# Patient Record
Sex: Male | Born: 1990 | Race: Black or African American | Hispanic: No | Marital: Single | Smoking: Never smoker
Health system: Southern US, Community
[De-identification: ages and names within clinical notes are randomized; demographics above are authoritative.]

## PROBLEM LIST (undated history)

## (undated) DIAGNOSIS — K529 Noninfective gastroenteritis and colitis, unspecified: Secondary | ICD-10-CM

## (undated) DIAGNOSIS — Z9289 Personal history of other medical treatment: Secondary | ICD-10-CM

## (undated) DIAGNOSIS — R079 Chest pain, unspecified: Secondary | ICD-10-CM

## (undated) HISTORY — DX: Personal history of other medical treatment: Z92.89

## (undated) HISTORY — PX: NO PAST SURGERIES: SHX2092

## (undated) HISTORY — PX: WISDOM TOOTH EXTRACTION: SHX21

---

## 2011-03-04 ENCOUNTER — Emergency Department (HOSPITAL_COMMUNITY): Payer: No Typology Code available for payment source

## 2011-03-04 ENCOUNTER — Emergency Department (HOSPITAL_COMMUNITY)
Admission: EM | Admit: 2011-03-04 | Discharge: 2011-03-04 | Disposition: A | Discharge status: Home / Self Care | Payer: No Typology Code available for payment source | Attending: Emergency Medicine | Admitting: Emergency Medicine

## 2011-03-04 DIAGNOSIS — T148XXA Other injury of unspecified body region, initial encounter: Secondary | ICD-10-CM | POA: Insufficient documentation

## 2011-03-04 DIAGNOSIS — M545 Low back pain, unspecified: Secondary | ICD-10-CM | POA: Insufficient documentation

## 2011-03-04 DIAGNOSIS — M542 Cervicalgia: Secondary | ICD-10-CM | POA: Insufficient documentation

## 2012-03-01 ENCOUNTER — Other Ambulatory Visit: Payer: Self-pay

## 2012-03-01 ENCOUNTER — Emergency Department (HOSPITAL_COMMUNITY): Payer: BC Managed Care – PPO

## 2012-03-01 ENCOUNTER — Emergency Department (HOSPITAL_COMMUNITY)
Admission: EM | Admit: 2012-03-01 | Discharge: 2012-03-01 | Disposition: A | Discharge status: Home / Self Care | Payer: BC Managed Care – PPO | Attending: Emergency Medicine | Admitting: Emergency Medicine

## 2012-03-01 ENCOUNTER — Encounter (HOSPITAL_COMMUNITY): Payer: Self-pay | Admitting: *Deleted

## 2012-03-01 DIAGNOSIS — R079 Chest pain, unspecified: Secondary | ICD-10-CM | POA: Insufficient documentation

## 2012-03-01 MED ORDER — TRAMADOL HCL 50 MG PO TABS: 50.0000 mg | ORAL_TABLET | Freq: Four times a day (QID) | ORAL | Status: AC | PRN

## 2012-03-01 MED ORDER — OXYCODONE-ACETAMINOPHEN 5-325 MG PO TABS
1.0000 | ORAL_TABLET | Freq: Once | ORAL | Status: AC
  Administered 2012-03-01: 1 via ORAL
  Filled 2012-03-01: qty 1

## 2012-03-01 NOTE — ED Provider Notes (Signed)
History     CSN: [id number]  Arrival date & time 03/01/12  1331   First MD Initiated Contact with Patient 03/01/12 1356      Chief Complaint  Patient presents with  . Chest Pain    (Consider location/radiation/quality/duration/timing/severity/associated sxs/prior treatment) Patient is a 21 y.o. male presenting with chest pain. The history is provided by the patient.  Chest Pain Pertinent negatives for primary symptoms include no fever, no shortness of breath, no cough, no palpitations, no nausea and no vomiting.  Pertinent negatives for associated symptoms include no diaphoresis.   pt c/o intermittent fleeting, recurrent left sided cp.   sxs last only a few seconds at a time. No radiation.   sxs occur frequently.  No cough, f/c/sob. No leg pain or swelling. No hx of recent travel or surgery.  Non smoker. No pmh.  Takes no meds.  History reviewed. No pertinent past medical history.  History reviewed. No pertinent past surgical history.  History reviewed. No pertinent family history.  History  Substance Use Topics  . Smoking status: Not on file  . Smokeless tobacco: Not on file  . Alcohol Use: Not on file      Review of Systems  Constitutional: Negative for fever, chills and diaphoresis.  Respiratory: Negative for cough, chest tightness and shortness of breath.   Cardiovascular: Positive for chest pain. Negative for palpitations and leg swelling.  Gastrointestinal: Negative for nausea and vomiting.  Skin: Negative for rash.  Neurological: Negative for headaches.  Psychiatric/Behavioral: Negative for confusion.  All other systems reviewed and are negative.    Allergies  Review of patient's allergies indicates no known allergies.  Home Medications  No current outpatient prescriptions on file.  BP 123/66  Pulse 71  Temp 97.4 F (36.3 C) (Oral)  Resp 16  Wt 135 lb (61.236 kg)  SpO2 100%  Physical Exam  Nursing note and vitals reviewed. Constitutional: He is  oriented to person, place, and time. He appears well-developed and well-nourished. No distress.  HENT:  Head: Normocephalic and atraumatic.  Eyes: Conjunctivae and EOM are normal.  Neck: Normal range of motion. Neck supple.  Cardiovascular: Normal rate.   No murmur heard. Pulmonary/Chest: Effort normal and breath sounds normal. No respiratory distress. He has no wheezes. He has no rales. He exhibits tenderness.       Mild left ant cw ttp. No crepitance. No masses  Abdominal: Soft. He exhibits no distension.  Musculoskeletal: Normal range of motion. He exhibits no edema.  Neurological: He is alert and oriented to person, place, and time.  Skin: Skin is warm and dry.  Psychiatric: He has a normal mood and affect. Thought content normal.    ED Course  Procedures (including critical care time) Young healthy male with no pmh had fleeting left cp.  Normal pe except mild cw ttp.  sxs not c/w acs. No risk factors for pe and sxs inconsistent with that also.  Nothing suggests pulm infx. No hx trauma.  ptx unlikely but will do cxr to verify absence of ptx.    Labs Reviewed - No data to display No results found.   No diagnosis found.  ECG nsr Rad S1Q3T3 Normal intervals Pattern of possible pe.  MDM  Intermittent left cp No evidence of pe, ptx, pneumonia. No suggestion of acs.  No distress, hypoxia.        Cheri Guppy, MD 03/01/12 1450

## 2012-03-01 NOTE — ED Notes (Addendum)
Patient is alert and oriented x3.  He is complaining of intermittent left chest pain that started this morning at 6 am.  He denies any radiation of pain, nausea, vomiting, lightheadedness, dizziness or diaphoresis. Patient currently rates the pain at a 9 of 10 and now he is saying that the right side of his face has numbness and tingling He states that he has never had any history of this issue.

## 2016-07-26 ENCOUNTER — Emergency Department (HOSPITAL_COMMUNITY): Payer: BLUE CROSS/BLUE SHIELD

## 2016-07-26 ENCOUNTER — Encounter (HOSPITAL_COMMUNITY): Payer: Self-pay | Admitting: Emergency Medicine

## 2016-07-26 ENCOUNTER — Emergency Department (HOSPITAL_COMMUNITY)
Admission: EM | Admit: 2016-07-26 | Discharge: 2016-07-26 | Disposition: A | Discharge status: Home / Self Care | Payer: BLUE CROSS/BLUE SHIELD | Attending: Emergency Medicine | Admitting: Emergency Medicine

## 2016-07-26 DIAGNOSIS — R1084 Generalized abdominal pain: Secondary | ICD-10-CM | POA: Diagnosis present

## 2016-07-26 DIAGNOSIS — Z79899 Other long term (current) drug therapy: Secondary | ICD-10-CM | POA: Insufficient documentation

## 2016-07-26 DIAGNOSIS — K529 Noninfective gastroenteritis and colitis, unspecified: Secondary | ICD-10-CM | POA: Diagnosis not present

## 2016-07-26 LAB — COMPREHENSIVE METABOLIC PANEL
ALBUMIN: 4.6 g/dL (ref 3.5–5.0)
ALT: 33 U/L (ref 17–63)
ANION GAP: 13 (ref 5–15)
AST: 34 U/L (ref 15–41)
Alkaline Phosphatase: 71 U/L (ref 38–126)
BILIRUBIN TOTAL: 0.8 mg/dL (ref 0.3–1.2)
BUN: 20 mg/dL (ref 6–20)
CHLORIDE: 99 mmol/L — AB (ref 101–111)
CO2: 23 mmol/L (ref 22–32)
Calcium: 9.4 mg/dL (ref 8.9–10.3)
Creatinine, Ser: 1.23 mg/dL (ref 0.61–1.24)
GFR calc Af Amer: >60 mL/min (ref >60)
GFR calc non Af Amer: >60 mL/min (ref >60)
GLUCOSE: 124 mg/dL — AB (ref 65–99)
POTASSIUM: 4 mmol/L (ref 3.5–5.1)
Sodium: 135 mmol/L (ref 135–145)
TOTAL PROTEIN: 8 g/dL (ref 6.5–8.1)

## 2016-07-26 LAB — LIPASE, BLOOD: LIPASE: 18 U/L (ref 11–51)

## 2016-07-26 LAB — URINALYSIS, ROUTINE W REFLEX MICROSCOPIC
Bilirubin Urine: NEGATIVE
Glucose, UA: NEGATIVE mg/dL
Hgb urine dipstick: NEGATIVE
Ketones, ur: NEGATIVE mg/dL
LEUKOCYTES UA: NEGATIVE
NITRITE: NEGATIVE
PH: 6 (ref 5.0–8.0)
Protein, ur: NEGATIVE mg/dL
SPECIFIC GRAVITY, URINE: 1.021 (ref 1.005–1.030)

## 2016-07-26 LAB — CBC
HEMATOCRIT: 49 % (ref 39.0–52.0)
HEMOGLOBIN: 17.2 g/dL — AB (ref 13.0–17.0)
MCH: 31.6 pg (ref 26.0–34.0)
MCHC: 35.1 g/dL (ref 30.0–36.0)
MCV: 90.1 fL (ref 78.0–100.0)
Platelets: 231 10*3/uL (ref 150–400)
RBC: 5.44 MIL/uL (ref 4.22–5.81)
RDW: 12.6 % (ref 11.5–15.5)
WBC: 15 10*3/uL — AB (ref 4.0–10.5)

## 2016-07-26 MED ORDER — ONDANSETRON HCL 4 MG PO TABS: 4.0000 mg | ORAL_TABLET | Freq: Four times a day (QID) | ORAL | 0 refills | Status: DC

## 2016-07-26 MED ORDER — SODIUM CHLORIDE 0.9 % IV BOLUS (SEPSIS)
1000.0000 mL | Freq: Once | INTRAVENOUS | Status: AC
  Administered 2016-07-26: 1000 mL via INTRAVENOUS

## 2016-07-26 MED ORDER — ONDANSETRON HCL 4 MG/2ML IJ SOLN
4.0000 mg | Freq: Once | INTRAMUSCULAR | Status: AC
  Administered 2016-07-26: 4 mg via INTRAVENOUS
  Filled 2016-07-26: qty 2

## 2016-07-26 MED ORDER — IOPAMIDOL (ISOVUE-300) INJECTION 61%
INTRAVENOUS | Status: AC
  Administered 2016-07-26: 100 mL
  Filled 2016-07-26: qty 100

## 2016-07-26 NOTE — ED Provider Notes (Signed)
[facility]-EMERGENCY DEPT Provider Note   CSN: [id number] Arrival date & time: 07/26/16  [id number]  By signing my name below, I, Joshua Christensen, attest that this documentation has been prepared under the direction and in the presence of Gilda Creasehristopher J Pollina, MD. Electronically signed, Joshua Christensen, ED Scribe. 07/26/16. 1:16 AM.  History   Chief Complaint Chief Complaint  Patient presents with  . Abdominal Pain  . Emesis    HPI HPI Comments: Joshua Christensen is a 25 y.o. male who presents to the Emergency Department complaining of generalized abdominal pain with multiple episodes of emesis that started yesterday. Pt also reports that he has had generalized body aches intermittently today. He has not taken any medications in attempt to relieve his symptoms. 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 No fever.   The history is provided by the patient. No language interpreter was used.    History reviewed. No pertinent past medical history.  There are no active problems to display for this patient.   History reviewed. No pertinent surgical history.   Home Medications    Prior to Admission medications   Medication Sig Start Date End Date Taking? Authorizing Provider  ondansetron (ZOFRAN) 4 MG tablet Take 1 tablet (4 mg total) by mouth every 6 (six) hours. 07/26/16   Gilda Creasehristopher J Pollina, MD    Family History No family history on file.  Social History Social History  Substance Use Topics  . Smoking status: Never Smoker  . Smokeless tobacco: Never Used  . Alcohol use No     Allergies   Patient has no known allergies.   Review of Systems Review of Systems  Constitutional: Negative for fever.  Gastrointestinal: Positive for abdominal pain, nausea and vomiting.  All other systems reviewed and are negative.    Physical Exam Updated Vital Signs BP 116/72   Pulse 101   Temp 98 F (36.7 C) (Oral)    Resp 16   Ht 5\' 5"  (1.651 m)   Wt 143 lb (64.9 kg)   SpO2 99%   BMI 23.80 kg/m   Physical Exam  Constitutional: He is oriented to person, place, and time. He appears well-developed and well-nourished. No distress.  HENT:  Head: Normocephalic and atraumatic.  Right Ear: Hearing normal.  Left Ear: Hearing normal.  Nose: Nose normal.  Mouth/Throat: Oropharynx is clear and moist and mucous membranes are normal.  Eyes: Conjunctivae and EOM are normal. Pupils are equal, round, and reactive to light.  Neck: Normal range of motion. Neck supple.  Cardiovascular: S1 normal and S2 normal.  Exam reveals no gallop and no friction rub.   No murmur heard. Tachycardic  Pulmonary/Chest: Effort normal and breath sounds normal. No respiratory distress. He exhibits no tenderness.  Abdominal: Soft. Normal appearance and bowel sounds are normal. There is no hepatosplenomegaly. There is tenderness. There is no rebound, no guarding, no tenderness at McBurney's point and negative Murphy's sign. No hernia.  Diffuse tenderness  Musculoskeletal: Normal range of motion.  Neurological: He is alert and oriented to person, place, and time. He has normal strength. No cranial nerve deficit or sensory deficit. Coordination normal. GCS eye subscore is 4. GCS verbal subscore is 5. GCS motor subscore is 6.  Skin: Skin is warm, dry and intact. No rash noted. No cyanosis.  Psychiatric: He has a normal mood and affect. His speech is normal and behavior is normal. Thought content normal.  Nursing  note and vitals reviewed.  Recheck at 02:36 - nausea improved but still experiencing some cramping. Abdominal exam reveals tenderness in the left lower quadrant  ED Treatments / Results  DIAGNOSTIC STUDIES: Oxygen Saturation is 97% on RA, normal by my interpretation.  COORDINATION OF CARE: 1:13 AM-Discussed treatment plan with pt at bedside and pt agreed to plan.   Labs (all labs ordered are listed, but only abnormal results  are displayed) Labs Reviewed  COMPREHENSIVE METABOLIC PANEL - Abnormal; Notable for the following:       Result Value   Chloride 99 (*)    Glucose, Bld 124 (*)    All other components within normal limits  CBC - Abnormal; Notable for the following:    WBC 15.0 (*)    Hemoglobin 17.2 (*)    All other components within normal limits  LIPASE, BLOOD  URINALYSIS, ROUTINE W REFLEX MICROSCOPIC    EKG  EKG Interpretation None       Radiology Ct Abdomen Pelvis W Contrast  Result Date: 07/26/2016 CLINICAL DATA:  Mid abdominal pain radiating to the back for 1 day. EXAM: CT ABDOMEN AND PELVIS WITH CONTRAST TECHNIQUE: Multidetector CT imaging of the abdomen and pelvis was performed using the standard protocol following bolus administration of intravenous contrast. CONTRAST:  ISOVUE-300 IOPAMIDOL (ISOVUE-300) INJECTION 61% COMPARISON:  None. FINDINGS: Lower chest: No acute abnormality. Hepatobiliary: No focal liver abnormality is seen. No gallstones, gallbladder wall thickening, or biliary dilatation. Pancreas: Unremarkable. No pancreatic ductal dilatation or surrounding inflammatory changes. Spleen: Normal in size without focal abnormality. Adrenals/Urinary Tract: Adrenal glands are unremarkable. Kidneys are normal, without renal calculi, focal lesion, or hydronephrosis. Bladder is unremarkable. Stomach/Bowel: Stomach is unremarkable. There is a generous volume of fluid within the lumen of unobstructed small bowel, as well as mild uniform small bowel mural enhancement. Colon is normal. Appendix is normal. Vascular/Lymphatic: No significant vascular findings are present. No enlarged abdominal or pelvic lymph nodes. Reproductive: Unremarkable Other: Small fat containing umbilical hernia. No ascites.  No focal inflammatory changes. Musculoskeletal: No significant skeletal lesion. IMPRESSION: Mildly prominent small bowel with uniform mural enhancement. This may represent enteritis. No bowel obstruction  or perforation. No focal inflammatory changes. Electronically Signed   By: Ellery Plunk M.D.   On: 07/26/2016 04:14    Procedures Procedures (including critical care time)  Medications Ordered in ED Medications  ondansetron (ZOFRAN) injection 4 mg (4 mg Intravenous Given 07/26/16 0125)  sodium chloride 0.9 % bolus 1,000 mL (0 mLs Intravenous Stopped 07/26/16 0304)    Followed by  sodium chloride 0.9 % bolus 1,000 mL (0 mLs Intravenous Stopped 07/26/16 0304)  iopamidol (ISOVUE-300) 61 % injection (100 mLs  Contrast Given 07/26/16 0347)     Initial Impression / Assessment and Plan / ED Course  I have reviewed the triage vital signs and the nursing notes.  Pertinent labs & imaging results that were available during my care of the patient were reviewed by me and considered in my medical decision making (see chart for details).  Clinical Course    Patient presents with complaints of abdominal pain with nausea and vomiting. Examination revealed diffuse tenderness, predominantly left lower quadrant. He did have leukocytosis present upon arrival with tachycardia. Patient treated with IV fluids and antiemetics. Nausea and vomiting has improved but he still had some pain and cramping. CT scan therefore performed. Patient has signs of enteritis but no other acute pathology.  Final Clinical Impressions(s) / ED Diagnoses   Final diagnoses:  Gastroenteritis  New Prescriptions New Prescriptions   ONDANSETRON (ZOFRAN) 4 MG TABLET    Take 1 tablet (4 mg total) by mouth every 6 (six) hours.  I personally performed the services described in this documentation, which was scribed in my presence. The recorded information has been reviewed and is accurate.     Gilda Creasehristopher J Pollina, MD 07/26/16 [id number]

## 2016-07-26 NOTE — ED Triage Notes (Signed)
Patient reports generalized abdominal pain with multiple emesis , fatigue and generalized body aches onset yesterday , denies fever or chills .

## 2016-07-26 NOTE — ED Notes (Signed)
Discharge instructions and prescription reviewed - voiced understanding.  

## 2016-08-21 ENCOUNTER — Emergency Department (HOSPITAL_COMMUNITY): Payer: BLUE CROSS/BLUE SHIELD

## 2016-08-21 ENCOUNTER — Observation Stay (HOSPITAL_COMMUNITY)
Admission: EM | Admit: 2016-08-21 | Discharge: 2016-08-23 | Disposition: A | Discharge status: Home / Self Care | Payer: BLUE CROSS/BLUE SHIELD | Attending: Internal Medicine | Admitting: Internal Medicine

## 2016-08-21 ENCOUNTER — Encounter (HOSPITAL_COMMUNITY): Payer: Self-pay | Admitting: Emergency Medicine

## 2016-08-21 DIAGNOSIS — I4510 Unspecified right bundle-branch block: Secondary | ICD-10-CM | POA: Diagnosis present

## 2016-08-21 DIAGNOSIS — R001 Bradycardia, unspecified: Secondary | ICD-10-CM | POA: Diagnosis present

## 2016-08-21 DIAGNOSIS — Z5181 Encounter for therapeutic drug level monitoring: Secondary | ICD-10-CM | POA: Diagnosis not present

## 2016-08-21 DIAGNOSIS — N289 Disorder of kidney and ureter, unspecified: Secondary | ICD-10-CM

## 2016-08-21 DIAGNOSIS — R0789 Other chest pain: Principal | ICD-10-CM | POA: Insufficient documentation

## 2016-08-21 DIAGNOSIS — R079 Chest pain, unspecified: Secondary | ICD-10-CM | POA: Diagnosis present

## 2016-08-21 HISTORY — DX: Chest pain, unspecified: R07.9

## 2016-08-21 HISTORY — DX: Noninfective gastroenteritis and colitis, unspecified: K52.9

## 2016-08-21 LAB — I-STAT CHEM 8, ED
BUN: 10 mg/dL (ref 6–20)
CHLORIDE: 101 mmol/L (ref 101–111)
Calcium, Ion: 1.17 mmol/L (ref 1.15–1.40)
Creatinine, Ser: 1.3 mg/dL — ABNORMAL HIGH (ref 0.61–1.24)
Glucose, Bld: 90 mg/dL (ref 65–99)
HEMATOCRIT: 47 % (ref 39.0–52.0)
HEMOGLOBIN: 16 g/dL (ref 13.0–17.0)
POTASSIUM: 3.9 mmol/L (ref 3.5–5.1)
SODIUM: 139 mmol/L (ref 135–145)
TCO2: 26 mmol/L (ref 0–100)

## 2016-08-21 LAB — I-STAT TROPONIN, ED: TROPONIN I, POC: 0 ng/mL (ref 0.00–0.08)

## 2016-08-21 MED ORDER — ASPIRIN 81 MG PO CHEW
81.0000 mg | CHEWABLE_TABLET | Freq: Once | ORAL | Status: AC
  Administered 2016-08-22: 81 mg via ORAL
  Filled 2016-08-21: qty 1

## 2016-08-21 NOTE — ED Provider Notes (Signed)
[facility]EMER[facility]ENCY DEPT Provider Note   CSN: [id number] Arrival date & time: 08/21/16  2009     History   Chief Complaint Chief Complaint  Patient presents with  . Anxiety  . URI    HPI Joshua Christensen is a 26 y.o. male.  This a normally healthy 26 year old male who states that he's had URI symptoms for approximately 5 days, but tonight while driving home from work, he developed chest pain radiating bilaterally, hand numbness, shortness of breath.  This was very concerning to him.  He pulled over and called EMS transported to the emergency department for further evaluation.  He states he is better at this time, but still having some mild chest discomfort.  Denies any shortness of breath, nausea, diaphoresis. He does have a positive family history of cardiac disease      History reviewed. No pertinent past medical history.  There are no active problems to display for this patient.   History reviewed. No pertinent surgical history.     Home Medications    Prior to Admission medications   Not on File    Family History History reviewed. No pertinent family history.  Social History Social History  Substance Use Topics  . Smoking status: Never Smoker  . Smokeless tobacco: Never Used  . Alcohol use No     Allergies   Patient has no known allergies.   Review of Systems Review of Systems  Constitutional: Negative for chills and fever.  HENT: Positive for congestion.   Respiratory: Negative for shortness of breath.   Cardiovascular: Positive for chest pain.  Neurological: Negative for dizziness, light-headedness and headaches.  All other systems reviewed and are negative.    Physical Exam Updated Vital Signs BP 134/85   Pulse 74   Temp 97.8 F (36.6 C) (Oral)   Resp 21   SpO2 100%   Physical Exam  Constitutional: He appears well-developed and well-nourished.  HENT:  Head: Normocephalic.  Mouth/Throat: Oropharynx is clear and moist.  Eyes: Pupils  are equal, round, and reactive to light.  Neck: Normal range of motion.  Cardiovascular: Normal pulses.  An irregular rhythm present. Bradycardia present.   Pulmonary/Chest: Effort normal and breath sounds normal.  Abdominal: Soft.  Musculoskeletal: Normal range of motion.  Neurological: He is alert.  Skin: Skin is warm and dry.  Psychiatric: He has a normal mood and affect.  Nursing note and vitals reviewed.    ED Treatments / Results  Labs (all labs ordered are listed, but only abnormal results are displayed) Labs Reviewed  CBC WITH DIFFERENTIAL/PLATELET - Abnormal; Notable for the following:       Result Value   WBC 15.8 (*)    Neutro Abs 13.0 (*)    Monocytes Absolute 1.2 (*)    All other components within normal limits  I-STAT CHEM 8, ED - Abnormal; Notable for the following:    Creatinine, Ser 1.30 (*)    All other components within normal limits  RAPID URINE DRUG SCREEN, HOSP PERFORMED  I-STAT TROPOININ, ED    EKG  EKG Interpretation  Date/Time:  Wednesday August 21 2016 20:12:19 EST Ventricular Rate:  65 PR Interval:  142 QRS Duration: 96 QT Interval:  406 QTC Calculation: 422 R Axis:   80 Text Interpretation:  Normal sinus rhythm with sinus arrhythmia Incomplete right bundle branch block Borderline ECG new RBBB Confirmed by Deretha Emory  MD, SCOTT [phone]) on 08/21/2016 11:21:06 PM       Radiology Dg Chest 2 View  Result Date: 08/21/2016 CLINICAL DATA:  Dyspnea with upper respiratory symptoms starting 1 week ago. EXAM: CHEST  2 VIEW COMPARISON:  03/01/2012 FINDINGS: The heart size and mediastinal contours are within normal limits. Both lungs are clear. The visualized skeletal structures are unremarkable. IMPRESSION: No active cardiopulmonary disease. Electronically Signed   By: Tollie Ethavid  Kwon M.D.   On: 08/21/2016 20:45    Procedures Procedures (including critical care time)  Medications Ordered in ED Medications  aspirin chewable tablet 81 mg (81 mg Oral Given  08/22/16 0029)     Initial Impression / Assessment and Plan / ED Course  I have reviewed the triage vital signs and the nursing notes.  Pertinent labs & imaging results that were available during my care of the patient were reviewed by me and considered in my medical decision making (see chart for details).  Clinical Course   History is concerning for ACS, most likely will admit patient for cardiac rule out with serial enzymes, EKGs  Spoke with cardiology concerning the change in EKG from right bundle branch block to normal sinus rhythm with early report.  They say it is strange and for the hospitalist to consult them in the morning.  They have no emergent input at this time.   Final Clinical Impressions(s) / ED Diagnoses   Final diagnoses:  Chest pain, unspecified type    New Prescriptions New Prescriptions   No medications on file     Earley FavorGail Mordechai Matuszak, NP 08/22/16 0105    Vanetta MuldersScott Zackowski, MD 08/24/16 [phone]

## 2016-08-21 NOTE — ED Triage Notes (Signed)
Pt also states [facility].

## 2016-08-21 NOTE — ED Provider Notes (Signed)
Patient with development of chest pain shortly prior to arrival. He was driving home at tingling in his arms or burning sensation in his feet get the anterior chest pain. Felt a little lightheaded. Patient is occasionally had chest pain in the past but never anything quite like this. EKG here today shows a new right bundle branch block compared to 2013. Troponins pending. But feel that patient requires admission for overnight the cardiac monitoring and serial cardiac enzymes. Realize that the patient is young. Patient denies any drugs. No hypoxia no shortness of breath. Heart rate is actually more of a sinus bradycardia with right bundle branch block.  The patient has had an upper respiratory infection recently. Not severe.  Medical screening examination/treatment/procedure(s) were conducted as a shared visit with non-physician practitioner(s) and myself.  I personally evaluated the patient during the encounter.   EKG Interpretation  Date/Time:  Wednesday August 21 2016 20:12:19 EST Ventricular Rate:  65 PR Interval:  142 QRS Duration: 96 QT Interval:  406 QTC Calculation: 422 R Axis:   80 Text Interpretation:  Normal sinus rhythm with sinus arrhythmia Incomplete right bundle branch block Borderline ECG new RBBB Confirmed by Jalina Blowers  MD, Advik Weatherspoon [phone]) on 08/21/2016 11:21:06 PM      Results for orders placed or performed during the hospital encounter of 07/26/16  Lipase, blood  Result Value Ref Range   Lipase 18 11 - 51 U/L  Comprehensive metabolic panel  Result Value Ref Range   Sodium 135 135 - 145 mmol/L   Potassium 4.0 3.5 - 5.1 mmol/L   Chloride 99 (L) 101 - 111 mmol/L   CO2 23 22 - 32 mmol/L   Glucose, Bld 124 (H) 65 - 99 mg/dL   BUN 20 6 - 20 mg/dL   Creatinine, Ser 6.04 0.61 - 1.24 mg/dL   Calcium 9.4 8.9 - 54.0 mg/dL   Total Protein 8.0 6.5 - 8.1 g/dL   Albumin 4.6 3.5 - 5.0 g/dL   AST 34 15 - 41 U/L   ALT 33 17 - 63 U/L   Alkaline Phosphatase 71 38 - 126 U/L   Total  Bilirubin 0.8 0.3 - 1.2 mg/dL   GFR calc non Af Amer >60 >60 mL/min   GFR calc Af Amer >60 >60 mL/min   Anion gap 13 5 - 15  CBC  Result Value Ref Range   WBC 15.0 (H) 4.0 - 10.5 K/uL   RBC 5.44 4.22 - 5.81 MIL/uL   Hemoglobin 17.2 (H) 13.0 - 17.0 g/dL   HCT 98.1 19.1 - 47.8 %   MCV 90.1 78.0 - 100.0 fL   MCH 31.6 26.0 - 34.0 pg   MCHC 35.1 30.0 - 36.0 g/dL   RDW 29.5 62.1 - 30.8 %   Platelets 231 150 - 400 K/uL  Urinalysis, Routine w reflex microscopic  Result Value Ref Range   Color, Urine YELLOW YELLOW   APPearance CLEAR CLEAR   Specific Gravity, Urine 1.021 1.005 - 1.030   pH 6.0 5.0 - 8.0   Glucose, UA NEGATIVE NEGATIVE mg/dL   Hgb urine dipstick NEGATIVE NEGATIVE   Bilirubin Urine NEGATIVE NEGATIVE   Ketones, ur NEGATIVE NEGATIVE mg/dL   Protein, ur NEGATIVE NEGATIVE mg/dL   Nitrite NEGATIVE NEGATIVE   Leukocytes, UA NEGATIVE NEGATIVE   Dg Chest 2 View  Result Date: 08/21/2016 CLINICAL DATA:  Dyspnea with upper respiratory symptoms starting 1 week ago. EXAM: CHEST  2 VIEW COMPARISON:  03/01/2012 FINDINGS: The heart size and mediastinal contours  are within normal limits. Both lungs are clear. The visualized skeletal structures are unremarkable. IMPRESSION: No active cardiopulmonary disease. Electronically Signed   By: Tollie Ethavid  Kwon M.D.   On: 08/21/2016 20:45   Ct Abdomen Pelvis W Contrast  Result Date: 07/26/2016 CLINICAL DATA:  Mid abdominal pain radiating to the back for 1 day. EXAM: CT ABDOMEN AND PELVIS WITH CONTRAST TECHNIQUE: Multidetector CT imaging of the abdomen and pelvis was performed using the standard protocol following bolus administration of intravenous contrast. CONTRAST:  100mL ISOVUE-300 IOPAMIDOL (ISOVUE-300) INJECTION 61% COMPARISON:  None. FINDINGS: Lower chest: No acute abnormality. Hepatobiliary: No focal liver abnormality is seen. No gallstones, gallbladder wall thickening, or biliary dilatation. Pancreas: Unremarkable. No pancreatic ductal dilatation or  surrounding inflammatory changes. Spleen: Normal in size without focal abnormality. Adrenals/Urinary Tract: Adrenal glands are unremarkable. Kidneys are normal, without renal calculi, focal lesion, or hydronephrosis. Bladder is unremarkable. Stomach/Bowel: Stomach is unremarkable. There is a generous volume of fluid within the lumen of unobstructed small bowel, as well as mild uniform small bowel mural enhancement. Colon is normal. Appendix is normal. Vascular/Lymphatic: No significant vascular findings are present. No enlarged abdominal or pelvic lymph nodes. Reproductive: Unremarkable Other: Small fat containing umbilical hernia. No ascites.  No focal inflammatory changes. Musculoskeletal: No significant skeletal lesion. IMPRESSION: Mildly prominent small bowel with uniform mural enhancement. This may represent enteritis. No bowel obstruction or perforation. No focal inflammatory changes. Electronically Signed   By: Ellery Plunkaniel R Mitchell M.D.   On: 07/26/2016 04:14      Vanetta MuldersScott Azyriah Nevins, MD 08/21/16 2329

## 2016-08-21 NOTE — ED Triage Notes (Signed)
Per EMS:  Pt was driving home tonight when he began to experiencing high anxiety, heavy breaths, tingling in his arms and legs, warm feet, and SOB.  EMs arrived at side of road for pt.  Pt sts no hx.  Lung sounds clear, but has had URI symptoms and concerned for 'walking pneumonia".  Pt is still having waves of anxiety with EMS during transport.

## 2016-08-22 ENCOUNTER — Observation Stay (HOSPITAL_BASED_OUTPATIENT_CLINIC_OR_DEPARTMENT_OTHER): Payer: BLUE CROSS/BLUE SHIELD

## 2016-08-22 ENCOUNTER — Observation Stay (HOSPITAL_COMMUNITY): Payer: BLUE CROSS/BLUE SHIELD

## 2016-08-22 ENCOUNTER — Encounter (HOSPITAL_COMMUNITY): Payer: Self-pay | Admitting: Physician Assistant

## 2016-08-22 DIAGNOSIS — N289 Disorder of kidney and ureter, unspecified: Secondary | ICD-10-CM

## 2016-08-22 DIAGNOSIS — R079 Chest pain, unspecified: Secondary | ICD-10-CM

## 2016-08-22 DIAGNOSIS — I4510 Unspecified right bundle-branch block: Secondary | ICD-10-CM | POA: Diagnosis not present

## 2016-08-22 DIAGNOSIS — R0789 Other chest pain: Secondary | ICD-10-CM

## 2016-08-22 DIAGNOSIS — R072 Precordial pain: Secondary | ICD-10-CM

## 2016-08-22 DIAGNOSIS — Z5181 Encounter for therapeutic drug level monitoring: Secondary | ICD-10-CM | POA: Diagnosis not present

## 2016-08-22 DIAGNOSIS — R001 Bradycardia, unspecified: Secondary | ICD-10-CM | POA: Diagnosis not present

## 2016-08-22 LAB — BASIC METABOLIC PANEL
Anion gap: 7 (ref 5–15)
BUN: 10 mg/dL (ref 6–20)
CALCIUM: 9 mg/dL (ref 8.9–10.3)
CO2: 23 mmol/L (ref 22–32)
CREATININE: 1.19 mg/dL (ref 0.61–1.24)
Chloride: 108 mmol/L (ref 101–111)
GFR calc Af Amer: >60 mL/min (ref >60)
GFR calc non Af Amer: >60 mL/min (ref >60)
GLUCOSE: 87 mg/dL (ref 65–99)
Potassium: 4 mmol/L (ref 3.5–5.1)
Sodium: 138 mmol/L (ref 135–145)

## 2016-08-22 LAB — CBC WITH DIFFERENTIAL/PLATELET
Basophils Absolute: 0 10*3/uL (ref 0.0–0.1)
Basophils Relative: 0 %
EOS ABS: 0.1 10*3/uL (ref 0.0–0.7)
EOS PCT: 0 %
HCT: 45.7 % (ref 39.0–52.0)
Hemoglobin: 15.9 g/dL (ref 13.0–17.0)
LYMPHS ABS: 1.5 10*3/uL (ref 0.7–4.0)
Lymphocytes Relative: 10 %
MCH: 31.3 pg (ref 26.0–34.0)
MCHC: 34.8 g/dL (ref 30.0–36.0)
MCV: 90 fL (ref 78.0–100.0)
MONO ABS: 1.2 10*3/uL — AB (ref 0.1–1.0)
MONOS PCT: 8 %
Neutro Abs: 13 10*3/uL — ABNORMAL HIGH (ref 1.7–7.7)
Neutrophils Relative %: 82 %
PLATELETS: 260 10*3/uL (ref 150–400)
RBC: 5.08 MIL/uL (ref 4.22–5.81)
RDW: 12.3 % (ref 11.5–15.5)
WBC: 15.8 10*3/uL — AB (ref 4.0–10.5)

## 2016-08-22 LAB — D-DIMER, QUANTITATIVE: D-Dimer, Quant: <0.27 ug/mL-FEU (ref 0.00–0.50)

## 2016-08-22 LAB — CBC
HEMATOCRIT: 45.4 % (ref 39.0–52.0)
Hemoglobin: 15 g/dL (ref 13.0–17.0)
MCH: 30.6 pg (ref 26.0–34.0)
MCHC: 33 g/dL (ref 30.0–36.0)
MCV: 92.7 fL (ref 78.0–100.0)
Platelets: 225 10*3/uL (ref 150–400)
RBC: 4.9 MIL/uL (ref 4.22–5.81)
RDW: 12.8 % (ref 11.5–15.5)
WBC: 11.9 10*3/uL — ABNORMAL HIGH (ref 4.0–10.5)

## 2016-08-22 LAB — SAVE SMEAR

## 2016-08-22 LAB — MONONUCLEOSIS SCREEN: Mono Screen: POSITIVE — AB

## 2016-08-22 LAB — RAPID URINE DRUG SCREEN, HOSP PERFORMED
Amphetamines: NOT DETECTED
Barbiturates: NOT DETECTED
Benzodiazepines: NOT DETECTED
Cocaine: NOT DETECTED
OPIATES: NOT DETECTED
Tetrahydrocannabinol: NOT DETECTED

## 2016-08-22 LAB — TROPONIN I
TROPONIN I: 1.32 ng/mL — AB (ref <0.03)
TROPONIN I: 0.03 ng/mL — AB (ref <0.03)
Troponin I: <0.03 ng/mL (ref <0.03)

## 2016-08-22 LAB — ECHOCARDIOGRAM COMPLETE
Height: 65 in
Weight: 2225.6 oz

## 2016-08-22 LAB — RAPID STREP SCREEN (MED CTR MEBANE ONLY): STREPTOCOCCUS, GROUP A SCREEN (DIRECT): NEGATIVE

## 2016-08-22 MED ORDER — MORPHINE SULFATE (PF) 4 MG/ML IV SOLN: 2.0000 mg | INTRAVENOUS | Status: DC | PRN

## 2016-08-22 MED ORDER — SODIUM CHLORIDE 0.9 % IV SOLN
Freq: Once | INTRAVENOUS | Status: AC
  Administered 2016-08-22: 03:00:00 via INTRAVENOUS

## 2016-08-22 MED ORDER — ENOXAPARIN SODIUM 40 MG/0.4ML ~~LOC~~ SOLN
40.0000 mg | SUBCUTANEOUS | Status: DC
  Administered 2016-08-22: 40 mg via SUBCUTANEOUS
  Filled 2016-08-22 (×2): qty 0.4

## 2016-08-22 MED ORDER — GADOBENATE DIMEGLUMINE 529 MG/ML IV SOLN
20.0000 mL | Freq: Once | INTRAVENOUS | Status: AC
  Administered 2016-08-22: 20 mL via INTRAVENOUS

## 2016-08-22 MED ORDER — CARVEDILOL 3.125 MG PO TABS
3.1250 mg | ORAL_TABLET | Freq: Two times a day (BID) | ORAL | Status: DC
  Administered 2016-08-22 – 2016-08-23 (×2): 3.125 mg via ORAL
  Filled 2016-08-22 (×2): qty 1

## 2016-08-22 MED ORDER — SODIUM CHLORIDE 0.9 % IV SOLN: INTRAVENOUS | Status: DC

## 2016-08-22 MED ORDER — SODIUM CHLORIDE 0.9 % IV BOLUS (SEPSIS)
500.0000 mL | Freq: Once | INTRAVENOUS | Status: AC
  Administered 2016-08-22: 500 mL via INTRAVENOUS

## 2016-08-22 MED ORDER — IPRATROPIUM-ALBUTEROL 0.5-2.5 (3) MG/3ML IN SOLN: 3.0000 mL | RESPIRATORY_TRACT | Status: DC | PRN

## 2016-08-22 MED ORDER — ACETAMINOPHEN 325 MG PO TABS: 650.0000 mg | ORAL_TABLET | ORAL | Status: DC | PRN

## 2016-08-22 MED ORDER — IBUPROFEN 600 MG PO TABS
600.0000 mg | ORAL_TABLET | Freq: Three times a day (TID) | ORAL | Status: DC
  Administered 2016-08-22 – 2016-08-23 (×3): 600 mg via ORAL
  Filled 2016-08-22 (×3): qty 1

## 2016-08-22 MED ORDER — ONDANSETRON HCL 4 MG/2ML IJ SOLN: 4.0000 mg | Freq: Four times a day (QID) | INTRAMUSCULAR | Status: DC | PRN

## 2016-08-22 NOTE — Progress Notes (Signed)
Pt seen and examined, admitted earlier this am by Dr.Smith 25/M with no PMH presents with chest pain/ troponin now 1.3, J point elevation on EKG in V2/V3, h/o URI/cough in past 1-2weeks Could be early pericarditis/myocarditis, although EKG not suggestive of pericarditis, ? takotsubo Cards consulted, plan for Cardiac MRI Supportive care, monitor Clinically no evidence of ACS  Joshua CovePreetha Mikolaj Woolstenhulme, MD [phone]

## 2016-08-22 NOTE — Progress Notes (Signed)
CRITICAL VALUE ALERT  Critical value received:  Troponin 1.32  Date of notification:  08/22/2016  Time of notification:  0635  Critical value read back: yes  Nurse who received alert:  Adolphus BirchwoodShanell Fletcher  MD notified (1st page):  Merdis DelayK Schorr NP  Time of first page:  [phone]  MD notified (2nd page):  Time of second page:  Responding MD:    Time MD responded:

## 2016-08-22 NOTE — Consult Note (Signed)
CARDIOLOGY CONSULT NOTE   Patient ID: Bricen Victory MRN: [id number] DOB/AGE: [birth year] 26 y.o.  Admit date: 08/21/2016  Requesting Physician: Dr. Jomarie Longs Primary Physician:   No PCP Per Patient Primary Cardiologist:  New Reason for Consultation:    Chest pain and elevated troponin  HPI: Jamaris Platten is a 26 y.o. male with a history of no past medical history who presented to [facility] emergency department last night via EMS for evaluation of chest pain.  He was seen in the emergency department on 07/26/16 for generalized abdominal pain with vomiting. WBC was elevated at 15.0. CT of the abdomen showed possible enteritis. This was felt to be viral so he was discharged home with no antibiotics. He eventually recovered from this and was feeling well. Last week he developed a cough and cold. There were many people at work that were sick with similar symptoms so he didn't think much of it. He was driving home from work last night when he had sudden onset of right-sided chest pain that was 9 out of 10 in severity. It was described as a pressure as well as sharp and stabbing. He had associated shortness of breath as well as bilateral hand numbness and warmth in his feet. He felt like he was going to pass out and pulled over to the side of the road where he called EMS. He denies syncope. The symptoms continued intermittently until he got to the hospital and they finally self resolved. He did not receive nitroglycerin or morphine. He is currently chest pain-free however last night he did feel like he had some difficulty breathing while laying flat. No LE edema or PND. He does note that he has previously had chest pain for at least 1-2 years that occured infrequently approximately every 2 months that was fleeting in nature and much less severe than last night. Of note, he was seen in the emergency department in 02/2012 for chest pain. Workup was negative and he was discharged home.  He does not  drink and is a never smoker. He denies any illicit drug use. Family history of CAD in his father and maternal grandfather. He did recently start an energy vitamin supplement from [facility]. He does not think this contains caffeine and is not sure the exact name.  In the emergency department first troponin I was negative however second troponin I returned at 1.32. WBC count was 11.9 with lymphocyte predominance. Creatinine 1.3. Urine drug screen negative. D-dimer negative. ECG with incomplete right bundle branch and sinus bradycardia. CXR with no active CP disease.  Past Medical History:  Diagnosis Date  . Chest pain   . Gastroenteritis      History reviewed. No pertinent surgical history.  No Known Allergies  I have reviewed the patient's current medications . enoxaparin (LOVENOX) injection  40 mg Subcutaneous Q24H    acetaminophen, ipratropium-albuterol, morphine injection, ondansetron (ZOFRAN) IV  Prior to Admission medications   Not on File     Social History   Social History  . Marital status: Single    Spouse name: N/A  . Number of children: N/A  . Years of education: N/A   Occupational History  . Not on file.   Social History Main Topics  . Smoking status: Never Smoker  . Smokeless tobacco: Never Used  . Alcohol use No  . Drug use: No  . Sexual activity: Not on file   Other Topics Concern  . Not on file   Social History  Narrative  . No narrative on file    Family Status  Relation Status  . Mother Alive  . Father   . Maternal Grandmother   . Maternal Grandfather    Family History  Problem Relation Age of Onset  . Healthy Mother   . CAD Father   . CVA Maternal Grandmother   . CAD Maternal Grandfather       ROS:  Full 14 point review of systems complete and found to be negative unless listed above.  Physical Exam: Blood pressure 126/62, pulse 62, temperature 97.9 F (36.6 C), temperature source Oral, resp. rate 20, height 5\' 5"  (1.651 m), weight 139 lb  1.6 oz (63.1 kg), SpO2 98 %.  General: Well developed, well nourished, male in no acute distress Head: Eyes PERRLA, No xanthomas.   Normocephalic and atraumatic, oropharynx without edema or exudate.  Lungs: CTAB Heart: HRRR S1 S2, no rub/gallop, Heart regular rate and rhythm with S1, S2  No murmur. pulses are 2+ extrem.   Neck: No carotid bruits. No lymphadenopathy. no JVD. Abdomen: Bowel sounds present, abdomen soft and non-tender without masses or hernias noted. Msk:  No spine or cva tenderness. No weakness, no joint deformities or effusions. Extremities: No clubbing or cyanosis. No LE edema.  Neuro: Alert and oriented X 3. No focal deficits noted. Psych:  Good affect, responds appropriately Skin: No rashes or lesions noted.  Labs:   Lab Results  Component Value Date   WBC 11.9 (H) 08/22/2016   HGB 15.0 08/22/2016   HCT 45.4 08/22/2016   MCV 92.7 08/22/2016   PLT 225 08/22/2016   No results for input(s): INR in the last 72 hours.   Recent Labs Lab 08/22/16 0642  NA 138  K 4.0  CL 108  CO2 23  BUN 10  CREATININE 1.19  CALCIUM 9.0  GLUCOSE 87   No results found for: MG  Recent Labs  08/22/16 0258 08/22/16 0541  TROPONINI <0.03 1.32*    Recent Labs  08/21/16 2330  TROPIPOC 0.00   No results found for: PROBNP No results found for: CHOL, HDL, LDLCALC, TRIG Lab Results  Component Value Date   DDIMER <0.27 08/22/2016   Lipase  Date/Time Value Ref Range Status  07/26/2016 12:55 AM 18 11 - 51 U/L Final   No results found for: TSH, T4TOTAL, T3FREE, THYROIDAB No results found for: VITAMINB12, FOLATE, FERRITIN, TIBC, IRON, RETICCTPCT  Echo: pending  ECG:  Sinus brady with IRBBB  Radiology:  Dg Chest 2 View  Result Date: 08/21/2016 CLINICAL DATA:  Dyspnea with upper respiratory symptoms starting 1 week ago. EXAM: CHEST  2 VIEW COMPARISON:  03/01/2012 FINDINGS: The heart size and mediastinal contours are within normal limits. Both lungs are clear. The  visualized skeletal structures are unremarkable. IMPRESSION: No active cardiopulmonary disease. Electronically Signed   By: Tollie Ethavid  Kwon M.D.   On: 08/21/2016 20:45    ASSESSMENT AND PLAN:    Principal Problem:   Chest pain Active Problems:   Incomplete RBBB   Renal insufficiency   Bradycardia   Chest pain:  first troponin I was negative however second troponin I returned at 1.32. With recent viral enteritis and possible rhinovirus, the possibility of pericarditis or myopericarditis is on the differential. 2D ECHO pending. Consider cardiac MRI for further workup. I do not think his history is consistent with ACS or coronary dissection. We'll continue to cycle enzymes and perform serial EKGs. Dr. Anne FuSkains to follow.    Signed: Cline CrockKathryn Thompson, PA-C  08/22/2016 9:04 AM  Pager [phone]  Personally seen and examined. Agree with above.  26 year old male with atypical chest pain, elevated troponin of 1.3, recent enteritis.  Question possible  Colitis, myocarditis, stress-induced cardiomyopathy. Do not believe that he is having acute coronary syndrome, he is chest pain-free, no significant ST segment changes on EKG. Low suspicion for spontaneous coronary artery dissection given his clinical presentation.   - Cardiac MRI. This be helpful to illustrate the cardia, myocardium to look for any signs of inflammation. This will also be helpful to quantify his ejection fraction. He is no longer short of breath, he is lying flat comfortably in bed currently. No rubs on exam. No murmurs. Could this be stress induced cardiomyopathy?, He did have a period of difficulty breathing but this may have been anxiety. Nonetheless, telemetry does not show any adverse arrhythmias. Await noninvasive imaging. Discussed with hospitalist team. His mother was in room as well. Agreeable with plan.  Donato Schultz, MD

## 2016-08-22 NOTE — H&P (Addendum)
History and Physical    Damany Car ZOX:[id number] DOB: [birth year] DOA: 08/21/2016  Referring MD/NP/PA: Earley FavorGail Schulz, NP PCP: No PCP Per Patient  Patient coming from: Highway via EMS   Chief Complaint:  Chest pain  HPI: Joshua Christensen is a 26 y.o. male without significant personal past medical history; who presents with complaints of acute onset of chest pain. Symptoms [place] 7 [place]. Patient notes acute onset of right-sided chest pain that was sharp in nature and did not radiate. Since onset symptoms have been intermittent lasting up to 4 minutes in duration than self resolving. Patient notes that taking deep breaths may help. Associated symptoms include blurry vision, near syncope, palpitations, shortness of breath, and hand/feet numbness. Patient gives this peculiar story including recently [facility] for which he had taken 2 doses last of which was taken this morning. Traveled by train approximately 2 weeks ago to [place] which he was reports took approximately 12 hours . Associated symptoms include intermittent leg cramps and headaches. Denies any nausea, vomiting, diarrhea, abdominal pain, fever, chills. Patient notes family history of cardiac disease in his grandparents possibly before the age of 26. He also has a grandmother who was reported to have some kind of blood disorder for which she is prone to blood clots. 3 weeks ago, patient was seen in the emergency department for abdominal pain for which a CT scan was performed and showed enteritis of the small bowel, but the patient was discharged home without antibiotics as it was suspected to be secondary to a virus.   ED Course: On admission patient was seen to be afebrile, heart rates 49-89, respirations up to 28, blood pressures and O2 saturations maintained. Laboratory revealed WBC 15.8, BUN 10, creatinine 1.3, and initial troponin negative. EKG initially  showed new possible RBBB that resolved on second EKG. Chest x-ray showed no acute abnormalities. TR[facility] called to admit for observation.  Review of Systems: As per HPI otherwise 10 point review of systems negative.   History reviewed. No pertinent past medical history.  History reviewed. No pertinent surgical history.   reports that he has never smoked. He has never used smokeless tobacco. He reports that he does not drink alcohol or use drugs.  No Known Allergies  History reviewed. No pertinent family history.  Prior to Admission medications   Not on File    Physical Exam: Vitals:   08/22/16 0015 08/22/16 0030 08/22/16 0032 08/22/16 0045  BP: 138/85 131/82  134/85  Pulse: 89 69  74  Resp: 20 22  21   Temp:   97.8 F (36.6 C)   TempSrc:   Oral   SpO2: 100% 100%  100%      Constitutional: NAD, calm, comfortable Vitals:   08/22/16 0015 08/22/16 0030 08/22/16 0032 08/22/16 0045  BP: 138/85 131/82  134/85  Pulse: 89 69  74  Resp: 20 22  21   Temp:   97.8 F (36.6 C)   TempSrc:   Oral   SpO2: 100% 100%  100%   Eyes: PERRL, lids and conjunctivae normal ENMT: Mucous membranes are moist. Posterior pharynx clear of any exudate or lesions.Normal dentition.  Neck: normal, supple, no masses, no thyromegaly Respiratory: clear to auscultation bilaterally, no wheezing, no crackles. Normal respiratory effort. No accessory muscle use.  Cardiovascular: Bradycardic, no murmurs / rubs / gallops. No extremity edema. 2+ pedal pulses. No carotid bruits.  Abdomen: no tenderness, no masses palpated. No hepatosplenomegaly.  Bowel sounds positive.  Musculoskeletal: no clubbing / cyanosis. No joint deformity upper and lower extremities. Good ROM, no contractures. Normal muscle tone.  Skin: no rashes, lesions, ulcers. No induration Neurologic: CN 2-12 grossly intact. Sensation intact, DTR normal. Strength 5/5 in all 4.  Psychiatric: Normal judgment and insight. Alert and oriented x 3. Normal mood.      Labs on Admission: I have personally reviewed following labs and imaging studies  CBC:  Recent Labs Lab 08/21/16 2333 08/21/16 2338  WBC 15.8*  --   NEUTROABS 13.0*  --   HGB 15.9 16.0  HCT 45.7 47.0  MCV 90.0  --   PLT 260  --    Basic Metabolic Panel:  Recent Labs Lab 08/21/16 2338  NA 139  K 3.9  CL 101  GLUCOSE 90  BUN 10  CREATININE 1.30*   GFR: CrCl cannot be calculated (Unknown ideal weight.). Liver Function Tests: No results for input(s): AST, ALT, ALKPHOS, BILITOT, PROT, ALBUMIN in the last 168 hours. No results for input(s): LIPASE, AMYLASE in the last 168 hours. No results for input(s): AMMONIA in the last 168 hours. Coagulation Profile: No results for input(s): INR, PROTIME in the last 168 hours. Cardiac Enzymes: No results for input(s): CKTOTAL, CKMB, CKMBINDEX, TROPONINI in the last 168 hours. BNP (last 3 results) No results for input(s): PROBNP in the last 8760 hours. HbA1C: No results for input(s): HGBA1C in the last 72 hours. CBG: No results for input(s): GLUCAP in the last 168 hours. Lipid Profile: No results for input(s): CHOL, HDL, LDLCALC, TRIG, CHOLHDL, LDLDIRECT in the last 72 hours. Thyroid Function Tests: No results for input(s): TSH, T4TOTAL, FREET4, T3FREE, THYROIDAB in the last 72 hours. Anemia Panel: No results for input(s): VITAMINB12, FOLATE, FERRITIN, TIBC, IRON, RETICCTPCT in the last 72 hours. Urine analysis:    Component Value Date/Time   COLORURINE YELLOW 07/26/2016 0341   APPEARANCEUR CLEAR 07/26/2016 0341   LABSPEC 1.021 07/26/2016 0341   PHURINE 6.0 07/26/2016 0341   GLUCOSEU NEGATIVE 07/26/2016 0341   HGBUR NEGATIVE 07/26/2016 0341   BILIRUBINUR NEGATIVE 07/26/2016 0341   KETONESUR NEGATIVE 07/26/2016 0341   PROTEINUR NEGATIVE 07/26/2016 0341   NITRITE NEGATIVE 07/26/2016 0341   LEUKOCYTESUR NEGATIVE 07/26/2016 0341   Sepsis Labs: No results found for this or any previous visit (from the past 240  hour(s)).   Radiological Exams on Admission: Dg Chest 2 View  Result Date: 08/21/2016 CLINICAL DATA:  Dyspnea with upper respiratory symptoms starting 1 week ago. EXAM: CHEST  2 VIEW COMPARISON:  03/01/2012 FINDINGS: The heart size and mediastinal contours are within normal limits. Both lungs are clear. The visualized skeletal structures are unremarkable. IMPRESSION: No active cardiopulmonary disease. Electronically Signed   By: Tollie Eth M.D.   On: 08/21/2016 20:45    EKG: Independently reviewed. Sinus rhythm with new RBBB   Assessment/Plan Chest pain with new incomplete RBBB Acute. No previous symptoms like this noted in the past.  Heart score 1. Given recent travel history also question possibility of a clot. Given patient's recent use of some energy supplement question possibility of arrhythmia. New EKG findings are also of concerned. - Admit telemetry bed - Chest pain or sets initiated - Will follow-up d-dimer and if elevated check CT angiogram of the chest  - Follow-up UDS, tsh - Trending cardiac troponin - check echocardiogram in a.m. - Counseled patient on the need to avoid use of energy supplements   Dyspnea: Unclear cause at this time. Chest x-ray negative.  Patient able to maintain O2 saturations on room air. - DuoNeb's as needed  Bradycardia: Acute. HRs noted to be as low as 49. - Continue to monitor   Leukocytosis: WBC elevated at 15.8. Previously,3 weeks ago seemed to be elevated at 15. Patient may need some hematologic workup for further evaluation of this. - Check peripheral smear, mono, strep - May warrant hematology follow-up as outpatient  Renal insufficiency: Acute. Creatinine slightly elevated at 1.3. Creatinine was 1.23 just 3 weeks ago. Question some aspect of dehydration. - IV fluids normal saline at 100 mL per hour 1 L - consider need to recheck BMP in a.m.  History of viral enteritis: Patient denies any current systems.  DVT prophylaxis: lovenox Code  Status: Full Family Communication: Discussed plan of care with patient and family present at bedside Disposition Plan: possible discharge home with negative workup Consults called: none Admission status: observation  Clydie Braun MD [facility] Pager [phone]  If 7PM-7AM, please contact night-coverage [email or web address] Password [email or web address]  08/22/2016, 1:16 AM

## 2016-08-22 NOTE — ED Notes (Signed)
Attempted report at this time.  Nurse to call back when available. 

## 2016-08-22 NOTE — ED Notes (Signed)
Admitting physician at the bedside.

## 2016-08-23 ENCOUNTER — Telehealth: Payer: Self-pay | Admitting: Nurse Practitioner

## 2016-08-23 DIAGNOSIS — I4510 Unspecified right bundle-branch block: Secondary | ICD-10-CM | POA: Diagnosis not present

## 2016-08-23 DIAGNOSIS — R072 Precordial pain: Secondary | ICD-10-CM | POA: Diagnosis not present

## 2016-08-23 DIAGNOSIS — N289 Disorder of kidney and ureter, unspecified: Secondary | ICD-10-CM | POA: Diagnosis not present

## 2016-08-23 LAB — HIV ANTIBODY (ROUTINE TESTING W REFLEX): HIV Screen 4th Generation wRfx: NONREACTIVE

## 2016-08-23 MED ORDER — CARVEDILOL 3.125 MG PO TABS: 3.1250 mg | ORAL_TABLET | Freq: Two times a day (BID) | ORAL | 3 refills | Status: DC

## 2016-08-23 MED ORDER — PANTOPRAZOLE SODIUM 40 MG PO TBEC: 40.0000 mg | DELAYED_RELEASE_TABLET | Freq: Every day | ORAL | Status: DC

## 2016-08-23 MED ORDER — IBUPROFEN 600 MG PO TABS: 600.0000 mg | ORAL_TABLET | Freq: Three times a day (TID) | ORAL | 0 refills | Status: DC

## 2016-08-23 MED ORDER — PANTOPRAZOLE SODIUM 40 MG PO TBEC: 40.0000 mg | DELAYED_RELEASE_TABLET | Freq: Every day | ORAL | 0 refills | Status: DC

## 2016-08-23 NOTE — Progress Notes (Signed)
Patient Name: Joshua Christensen Date of Encounter: 08/23/2016  Primary Cardiologist: Dr. [facility] Problem List     Principal Problem:   Chest pain Active Problems:   Incomplete RBBB   Renal insufficiency   Bradycardia     Subjective   Feeling well. Up working on computer in bed. No chest pain. Ready to go back to work.   Inpatient Medications    Scheduled Meds: . carvedilol  3.125 mg Oral BID WC  . enoxaparin (LOVENOX) injection  40 mg Subcutaneous Q24H  . ibuprofen  600 mg Oral Q8H   Continuous Infusions: . sodium chloride 75 mL/hr at 08/22/16 1800   PRN Meds: acetaminophen, ipratropium-albuterol, morphine injection, ondansetron (ZOFRAN) IV   Vital Signs    Vitals:   08/22/16 1404 08/22/16 1647 08/22/16 2117 08/23/16 0547  BP: (!) 114/50 113/71 (!) 101/44 126/72  Pulse: 62 65 72   Resp:    15  Temp: 97.9 F (36.6 C)  98.2 F (36.8 C) 98 F (36.7 C)  TempSrc: Oral  Oral Oral  SpO2: 100%  100% 99%  Weight:    137 lb 12.8 oz (62.5 kg)  Height:        Intake/Output Summary (Last 24 hours) at 08/23/16 0751 Last data filed at 08/23/16 0600  Gross per 24 hour  Intake          2348.25 ml  Output              300 ml  Net          2048.25 ml   Filed Weights   08/22/16 0258 08/23/16 0547  Weight: 139 lb 1.6 oz (63.1 kg) 137 lb 12.8 oz (62.5 kg)    Physical Exam   GEN: Well nourished, well developed, in no acute distress.  HEENT: Grossly normal.  Neck: Supple, no JVD, carotid bruits, or masses. Cardiac: RRR, no murmurs, rubs, or gallops. No clubbing, cyanosis, edema.  Radials/DP/PT 2+ and equal bilaterally.  Respiratory:  Respirations regular and unlabored, clear to auscultation bilaterally. GI: Soft, nontender, nondistended, BS + x 4. MS: no deformity or atrophy. Skin: warm and dry, no rash. Neuro:  Strength and sensation are intact. Psych: AAOx3.  Normal affect.  Labs    CBC  Recent Labs  08/21/16 2333 08/21/16 2338 08/22/16 0642    WBC 15.8*  --  11.9*  NEUTROABS 13.0*  --   --   HGB 15.9 16.0 15.0  HCT 45.7 47.0 45.4  MCV 90.0  --  92.7  PLT 260  --  225   Basic Metabolic Panel  Recent Labs  08/21/16 2338 08/22/16 0642  NA 139 138  K 3.9 4.0  CL 101 108  CO2  --  23  GLUCOSE 90 87  BUN 10 10  CREATININE 1.30* 1.19  CALCIUM  --  9.0   Liver Function Tests No results for input(s): AST, ALT, ALKPHOS, BILITOT, PROT, ALBUMIN in the last 72 hours. No results for input(s): LIPASE, AMYLASE in the last 72 hours. Cardiac Enzymes  Recent Labs  08/22/16 0258 08/22/16 0541 08/22/16 0732  TROPONINI <0.03 1.32* 0.03*   BNP Invalid input(s): POCBNP D-Dimer  Recent Labs  08/22/16 0130  DDIMER <0.27   Hemoglobin A1C No results for input(s): HGBA1C in the last 72 hours. Fasting Lipid Panel No results for input(s): CHOL, HDL, LDLCALC, TRIG, CHOLHDL, LDLDIRECT in the last 72 hours. Thyroid Function Tests No results for input(s): TSH, T4TOTAL, T3FREE, THYROIDAB in the last 72  hours.  Invalid input(s): FREET3  Telemetry    Sinus bradycardia as well as some periods of sinus tachycardia - Personally Reviewed  ECG    NSR with IRBBB - Personally Reviewed  Radiology    Dg Chest 2 View  Result Date: 08/21/2016 CLINICAL DATA:  Dyspnea with upper respiratory symptoms starting 1 week ago. EXAM: CHEST  2 VIEW COMPARISON:  03/01/2012 FINDINGS: The heart size and mediastinal contours are within normal limits. Both lungs are clear. The visualized skeletal structures are unremarkable. IMPRESSION: No active cardiopulmonary disease. Electronically Signed   By: Tollie Ethavid  Kwon M.D.   On: 08/21/2016 20:45   Mr Cardiac Morphology W Wo Contrast  Result Date: 08/22/2016 CLINICAL DATA:  26 year old with atypical chest pain, elevated troponin and positive mononucleosis screen. EXAM: CARDIAC MRI TECHNIQUE: The patient was scanned on a 1.5 Tesla GE magnet. A dedicated cardiac coil was used. Functional imaging was done using  Fiesta sequences. 2,3, and 4 chamber views were done to assess for RWMA's. Modified Simpson's rule using a short axis stack was used to calculate an ejection fraction on a dedicated work Research officer, trade unionstation using Circle software. The patient received 24 cc of Multihance. After 10 minutes inversion recovery sequences were used to assess for infiltration and scar tissue. CONTRAST:  24 cc  of Multihance FINDINGS: 1. Normal left ventricular size, thickness and systolic function (LVEF = 58%) with no regional wall motion abnormalities. There is mild diffuse mid epicardial late gadolinium enhancement in the mid anterior, anteroseptal, apical septal and anterior walls. LVEDD:  54 mm LVESD:  38 mm LVEDV:  147 ml LVESV; 62 ml SV:  86 ml CO:  5.0 L/min Myocardial mass:  97 g 2. Normal right ventricular size, thickness and systolic function (LVEF = 55%) with no regional wall motion abnormalities. RVEDV:  155 ml RVESV:  70 ml SV:  85 ml CO:  4.9 L/min 3.  Normal biatrial size. 4.  Mild tricuspid regurgitation. 5. Normal size of the aortic root and thoracic aorta. 6.  Normal pericardium, no pericardial effusion. IMPRESSION: 1. Normal left ventricular size, thickness and systolic function (LVEF = 58%) with no regional wall motion abnormalities. There is mild diffuse mid epicardial late gadolinium enhancement in the mid anterior, anteroseptal, apical septal and anterior walls. 2. Normal right ventricular size, thickness and systolic function (LVEF = 55%) with no regional wall motion abnormalities. 3.  Normal biatrial size. 4.  Mild tricuspid regurgitation. 5. Normal size of the aortic root and thoracic aorta. 6.  Normal pericardium, no pericardial effusion. 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 No evidence for pericarditis. Tobias AlexanderKatarina Nelson Electronically Signed   By: Tobias AlexanderKatarina  Nelson   On: 08/22/2016 15:29    Cardiac Studies   Cardiac MR  08/23/15 IMPRESSION: 1. Normal left ventricular size, thickness and systolic function (LVEF = 58%) with no regional wall motion abnormalities. There is mild diffuse mid epicardial late gadolinium enhancement in the mid anterior, anteroseptal, apical septal and anterior walls. 2. Normal right ventricular size, thickness and systolic function (LVEF = 55%) with no regional wall motion abnormalities. 3.  Normal biatrial size. 4.  Mild tricuspid regurgitation. 5. Normal size of the aortic root and thoracic aorta. 6.  Normal pericardium, no pericardial effusion. 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 No evidence for pericarditis.   2D ECHO: 08/22/2016 LV EF: 55% -   60% Study  Conclusions - Left ventricle: The cavity size was normal. Systolic function was   normal. The estimated ejection fraction was in the range of 55%   to 60%. Wall motion was normal; there were no regional wall   motion abnormalities. Left ventricular diastolic function   parameters were normal. - Aortic valve: Trileaflet; normal thickness leaflets. There was no   regurgitation. - Aortic root: The aortic root was normal in size. - Mitral valve: There was no regurgitation. - Left atrium: The atrium was normal in size. - Right ventricle: The cavity size was normal. Wall thickness was   normal. Systolic function was normal. - Right atrium: The atrium was normal in size. - Tricuspid valve: There was trivial regurgitation. - Pulmonic valve: There was no regurgitation. - Pulmonary arteries: Systolic pressure was within the normal   range. - Inferior vena cava: The vessel was normal in size. - Pericardium, extracardiac: There was no pericardial effusion. Impressions: - Normal study.   Patient Profile     Malin Cairns is a 26 y.o. male with a history of no past medical history who presented to [facility] emergency  department last night via EMS for evaluation of chest pain.  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 Mild case with normal LV function. Plan to treat with BBs and ibuprofen 600mg  TID  x 2 weeks. I have arranged follow up in 2 weeks with me. He is okay to go back to work.    Signed, Cline Crock, PA-C  08/23/2016, 7:51 AM

## 2016-08-23 NOTE — Discharge Summary (Signed)
Physician Discharge Summary   Patient ID: Joshua Christensen MRN: [id number] DOB/AGE: 08-30-1990 25 y.o.  Admit date: 08/21/2016 Discharge date: 08/23/2016  Primary Care Physician:  No PCP Per Patient  Discharge Diagnoses:    . Acute viral myocarditis . Incomplete RBBB . Elevated troponin   Acute kidney injury      Consults:  Cardiology  Recommendations for Outpatient Follow-up:  1. Please repeat CBC/BMET at next visit   DIET: Regular diet    Allergies:  No Known Allergies   DISCHARGE MEDICATIONS: Discharge Medication List as of 08/23/2016  9:18 AM    START taking these medications   Details  carvedilol (COREG) 3.125 MG tablet Take 1 tablet (3.125 mg total) by mouth 2 (two) times daily with a meal., Starting Fri 08/23/2016, Print    ibuprofen (ADVIL,MOTRIN) 600 MG tablet Take 1 tablet (600 mg total) by mouth 3 (three) times daily. X 2 weeks, Starting Fri 08/23/2016, Print    pantoprazole (PROTONIX) 40 MG tablet Take 1 tablet (40 mg total) by mouth daily. X 2 weeks, while on high dose ibuprofen, Starting Fri 08/23/2016, Print         Brief H and P: For complete details please refer to admission H and P, but in brief Joshua Christensen is a 26 y.o. male without significant personal past medical history; who presents with complaints of acute onset of chest pain. Symptoms [place] 7 [place]. Patient notes acute onset of right-sided chest pain that was sharp in nature and did not radiate. Since onset symptoms have been intermittent lasting up to 4 minutes in duration than self resolving. Patient notes that taking deep breaths may help. Associated symptoms include blurry vision, near syncope, palpitations, shortness of breath, and hand/feet numbness. Patient gives this peculiar story including recently starting some energy vitamin from [facility] for which he had taken 2 doses last of which was taken this morning. Traveled by train approximately 2 weeks  ago to [place] which he was reports took approximately 12 hours . Associated symptoms include intermittent leg cramps and headaches. Denies any nausea, vomiting, diarrhea, abdominal pain, fever, chills. Patient noted family history of cardiac disease in his grandparents possibly before the age of 50. He also has a grandmother who was reported to have some kind of blood disorder for which she is prone to blood clots. 3 weeks ago, patient was seen in the emergency department for abdominal pain for which a CT scan was performed and showed enteritis of the small bowel, but the patient was discharged home without antibiotics as it was suspected to be secondary to a virus.   ED Course: On admission patient was seen to be afebrile, heart rates 49-89, respirations up to 28, blood pressures and O2 saturations maintained. Laboratory revealed WBC 15.8, BUN 10, creatinine 1.3, and initial troponin negative. EKG initially showed new possible RBBB that resolved on second EKG. Chest x-ray showed no acute abnormalities. TR[facility] called to admit for observation.  Hospital Course:   Atypical Chest pain with new incomplete RBBB Acute. Secondary to acute myocarditis - Patient did not have any significant ST segment changes on EKG however had positive troponins. Hence cardiology was consulted. Highest troponin 1.32. D-dimer was negative. - UDS was negative  - Patient underwent 2-D echo which showed EF of 55-60% with no regional wall motion abnormalities. No pericardial effusion. - Patient underwent cardiac MRI which showed EF of 58% with no regional wall motion abnormality. Mild diffuse epicardial late gadolinium  enhancement in the mid anterior anteroseptal and apical septal and anterior walls. Normal pericardium and no pericardial effusion. Collectively these findings are consistent with acute myocarditis with involvement of 4 segments and preserved biventricular function. No evidence of pericarditis.  - Possibly acute  viral myocarditis due to mononucleosis -Cardiology recommended treatment with beta blocker and ibuprofen 600 mg TID for 2 weeks. He was also placed on Protonix daily for 2 weeks - Follow up arranged with cardiology.    Leukocytosis: WBC elevated at 15.8. Previously,3 weeks ago seemed to be elevated at 15.  -Mono screen was positive, see outpatient PCP  Renal insufficiency: Acute. Creatinine slightly elevated at 1.3. Creatinine was 1.23 just 3 weeks ago. - Improved with IV fluid hydration, creatinine 1.1 at the time of discharge.  Day of Discharge BP 126/72 (BP Location: Right Arm)   Pulse 72   Temp 98 F (36.7 C) (Oral)   Resp 15   Ht 5\' 5"  (1.651 m)   Wt 62.5 kg (137 lb 12.8 oz)   SpO2 99%   BMI 22.93 kg/m   Physical Exam: General: Alert and awake oriented x3 not in any acute distress. HEENT: anicteric sclera, pupils reactive to light and accommodation CVS: S1-S2 clear no murmur rubs or gallops Chest: clear to auscultation bilaterally, no wheezing rales or rhonchi Abdomen: soft nontender, nondistended, normal bowel sounds Extremities: no cyanosis, clubbing or edema noted bilaterally Neuro: Cranial nerves II-XII intact, no focal neurological deficits   The results of significant diagnostics from this hospitalization (including imaging, microbiology, ancillary and laboratory) are listed below for reference.    LAB RESULTS: Basic Metabolic Panel:  Recent Labs Lab 08/21/16 2338 08/22/16 0642  NA 139 138  K 3.9 4.0  CL 101 108  CO2  --  23  GLUCOSE 90 87  BUN 10 10  CREATININE 1.30* 1.19  CALCIUM  --  9.0   Liver Function Tests: No results for input(s): AST, ALT, ALKPHOS, BILITOT, PROT, ALBUMIN in the last 168 hours. No results for input(s): LIPASE, AMYLASE in the last 168 hours. No results for input(s): AMMONIA in the last 168 hours. CBC:  Recent Labs Lab 08/21/16 2333 08/21/16 2338 08/22/16 0642  WBC 15.8*  --  11.9*  NEUTROABS 13.0*  --   --   HGB  15.9 16.0 15.0  HCT 45.7 47.0 45.4  MCV 90.0  --  92.7  PLT 260  --  225   Cardiac Enzymes:  Recent Labs Lab 08/22/16 0541 08/22/16 0732  TROPONINI 1.32* 0.03*   BNP: Invalid input(s): POCBNP CBG: No results for input(s): GLUCAP in the last 168 hours.  Significant Diagnostic Studies:  Dg Chest 2 View  Result Date: 08/21/2016 CLINICAL DATA:  Dyspnea with upper respiratory symptoms starting 1 week ago. EXAM: CHEST  2 VIEW COMPARISON:  03/01/2012 FINDINGS: The heart size and mediastinal contours are within normal limits. Both lungs are clear. The visualized skeletal structures are unremarkable. IMPRESSION: No active cardiopulmonary disease. Electronically Signed   By: Tollie Eth M.D.   On: 08/21/2016 20:45   Mr Cardiac Morphology W Wo Contrast  Result Date: 08/22/2016 CLINICAL DATA:  26 year old with atypical chest pain, elevated troponin and positive mononucleosis screen. EXAM: CARDIAC MRI TECHNIQUE: The patient was scanned on a 1.5 Tesla GE magnet. A dedicated cardiac coil was used. Functional imaging was done using Fiesta sequences. 2,3, and 4 chamber views were done to assess for RWMA's. Modified Simpson's rule using a short axis stack was used to calculate an ejection  fraction on a dedicated work Research officer, trade unionstation using Circle software. The patient received 24 cc of Multihance. After 10 minutes inversion recovery sequences were used to assess for infiltration and scar tissue. CONTRAST:  24 cc  of Multihance FINDINGS: 1. Normal left ventricular size, thickness and systolic function (LVEF = 58%) with no regional wall motion abnormalities. There is mild diffuse mid epicardial late gadolinium enhancement in the mid anterior, anteroseptal, apical septal and anterior walls. LVEDD:  54 mm LVESD:  38 mm LVEDV:  147 ml LVESV; 62 ml SV:  86 ml CO:  5.0 L/min Myocardial mass:  97 g 2. Normal right ventricular size, thickness and systolic function (LVEF = 55%) with no regional wall motion abnormalities. RVEDV:   155 ml RVESV:  70 ml SV:  85 ml CO:  4.9 L/min 3.  Normal biatrial size. 4.  Mild tricuspid regurgitation. 5. Normal size of the aortic root and thoracic aorta. 6.  Normal pericardium, no pericardial effusion. IMPRESSION: 1. Normal left ventricular size, thickness and systolic function (LVEF = 58%) with no regional wall motion abnormalities. There is mild diffuse mid epicardial late gadolinium enhancement in the mid anterior, anteroseptal, apical septal and anterior walls. 2. Normal right ventricular size, thickness and systolic function (LVEF = 55%) with no regional wall motion abnormalities. 3.  Normal biatrial size. 4.  Mild tricuspid regurgitation. 5. Normal size of the aortic root and thoracic aorta. 6.  Normal pericardium, no pericardial effusion. 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 No evidence for pericarditis. Tobias AlexanderKatarina Nelson Electronically Signed   By: Tobias AlexanderKatarina  Nelson   On: 08/22/2016 15:29    2D ECHO: Study Conclusions  - Left ventricle: The cavity size was normal. Systolic function was   normal. The estimated ejection fraction was in the range of 55%   to 60%. Wall motion was normal; there were no regional wall   motion abnormalities. Left ventricular diastolic function   parameters were normal. - Aortic valve: Trileaflet; normal thickness leaflets. There was no   regurgitation. - Aortic root: The aortic root was normal in size. - Mitral valve: There was no regurgitation. - Left atrium: The atrium was normal in size. - Right ventricle: The cavity size was normal. Wall thickness was   normal. Systolic function was normal. - Right atrium: The atrium was normal in size. - Tricuspid valve: There was trivial regurgitation. - Pulmonic valve: There was no regurgitation. - Pulmonary arteries: Systolic pressure was within the normal   range. - Inferior vena cava: The vessel was normal in  size. - Pericardium, extracardiac: There was no pericardial effusion.  Impressions:  - Normal study.  Disposition and Follow-up: Discharge Instructions    Diet general    Complete by:  As directed    Increase activity slowly    Complete by:  As directed        DISPOSITION: home    DISCHARGE FOLLOW-UP Follow-up Information    Cline CrockKathryn Thompson, PA-C Follow up on 09/06/2016.   Specialties:  Cardiology, Radiology Why:  at 8:00 AM  Contact information: [address] [place] [id number] [phone]            Time spent on Discharge: 25mins   Signed:   RAI,RIPUDEEP M.D. [facility] 08/23/2016, 12:52 PM Pager: [phone]

## 2016-08-23 NOTE — Telephone Encounter (Signed)
Patient [facility] on 08/23/16.  Patient understands to follow up with provider Carlean JewsKatie Thompson, PA on 09/06/16 at 8:[facility] am [facility] Patient understands discharge instructions? Yes Patient understands medications and regiment? Yes Patient understands to bring all medications to this visit? Yes  Patient denies complaints and is aware to call our office with questions or concerns prior to follow-up appointment

## 2016-08-24 LAB — CULTURE, GROUP A STREP (THRC)

## 2016-08-25 ENCOUNTER — Observation Stay (HOSPITAL_COMMUNITY)
Admission: EM | Admit: 2016-08-25 | Discharge: 2016-08-27 | Disposition: A | Discharge status: Home / Self Care | Payer: BLUE CROSS/BLUE SHIELD | Attending: Internal Medicine | Admitting: Internal Medicine

## 2016-08-25 ENCOUNTER — Encounter (HOSPITAL_COMMUNITY): Payer: Self-pay

## 2016-08-25 ENCOUNTER — Emergency Department (HOSPITAL_COMMUNITY): Payer: BLUE CROSS/BLUE SHIELD

## 2016-08-25 DIAGNOSIS — L03116 Cellulitis of left lower limb: Secondary | ICD-10-CM | POA: Diagnosis not present

## 2016-08-25 DIAGNOSIS — R0902 Hypoxemia: Principal | ICD-10-CM | POA: Insufficient documentation

## 2016-08-25 DIAGNOSIS — I514 Myocarditis, unspecified: Secondary | ICD-10-CM | POA: Diagnosis present

## 2016-08-25 DIAGNOSIS — R0602 Shortness of breath: Secondary | ICD-10-CM | POA: Diagnosis present

## 2016-08-25 DIAGNOSIS — J9601 Acute respiratory failure with hypoxia: Secondary | ICD-10-CM | POA: Diagnosis present

## 2016-08-25 DIAGNOSIS — Z79899 Other long term (current) drug therapy: Secondary | ICD-10-CM | POA: Diagnosis not present

## 2016-08-25 LAB — BASIC METABOLIC PANEL
ANION GAP: 11 (ref 5–15)
BUN: 9 mg/dL (ref 6–20)
CHLORIDE: 99 mmol/L — AB (ref 101–111)
CO2: 24 mmol/L (ref 22–32)
Calcium: 9.6 mg/dL (ref 8.9–10.3)
Creatinine, Ser: 1.12 mg/dL (ref 0.61–1.24)
GFR calc Af Amer: >60 mL/min (ref >60)
Glucose, Bld: 102 mg/dL — ABNORMAL HIGH (ref 65–99)
POTASSIUM: 4 mmol/L (ref 3.5–5.1)
SODIUM: 134 mmol/L — AB (ref 135–145)

## 2016-08-25 LAB — CBC
HEMATOCRIT: 45.4 % (ref 39.0–52.0)
HEMOGLOBIN: 15.6 g/dL (ref 13.0–17.0)
MCH: 30.7 pg (ref 26.0–34.0)
MCHC: 34.4 g/dL (ref 30.0–36.0)
MCV: 89.4 fL (ref 78.0–100.0)
Platelets: 285 10*3/uL (ref 150–400)
RBC: 5.08 MIL/uL (ref 4.22–5.81)
RDW: 12.2 % (ref 11.5–15.5)
WBC: 9.2 10*3/uL (ref 4.0–10.5)

## 2016-08-25 LAB — I-STAT TROPONIN, ED
Troponin i, poc: 0 ng/mL (ref 0.00–0.08)
Troponin i, poc: 0 ng/mL (ref 0.00–0.08)

## 2016-08-25 NOTE — ED Triage Notes (Signed)
Pt complaining of SOB. Pt states recently seen here for myocarditis. Pt states discharged on Friday. Worsening SOB today. Pt denies any chest pain.

## 2016-08-25 NOTE — ED Notes (Signed)
Mother in waiting room complaining stating that he son needs to go to a room now before everyone else, mother states that she was told 30 minutes ago he would go to a room. Explained to mother that room was taken by EMS traffic and was just placed into another room and it is being cleaned. Mother angry and states her son is having an emergency because he is SOB. Pt stats checked several at time of her last complaint, o2 stats 100%, pt sitting up watching videos on phone with no distress noted.

## 2016-08-26 ENCOUNTER — Observation Stay (HOSPITAL_COMMUNITY): Payer: BLUE CROSS/BLUE SHIELD

## 2016-08-26 ENCOUNTER — Encounter (HOSPITAL_COMMUNITY): Payer: Self-pay | Admitting: Internal Medicine

## 2016-08-26 DIAGNOSIS — L03116 Cellulitis of left lower limb: Secondary | ICD-10-CM | POA: Diagnosis not present

## 2016-08-26 DIAGNOSIS — A3952 Meningococcal myocarditis: Secondary | ICD-10-CM

## 2016-08-26 DIAGNOSIS — L0390 Cellulitis, unspecified: Secondary | ICD-10-CM | POA: Insufficient documentation

## 2016-08-26 DIAGNOSIS — R0902 Hypoxemia: Secondary | ICD-10-CM | POA: Diagnosis not present

## 2016-08-26 DIAGNOSIS — I514 Myocarditis, unspecified: Secondary | ICD-10-CM | POA: Diagnosis present

## 2016-08-26 DIAGNOSIS — J9601 Acute respiratory failure with hypoxia: Secondary | ICD-10-CM | POA: Diagnosis present

## 2016-08-26 DIAGNOSIS — B3322 Viral myocarditis: Secondary | ICD-10-CM

## 2016-08-26 DIAGNOSIS — Z79899 Other long term (current) drug therapy: Secondary | ICD-10-CM | POA: Diagnosis not present

## 2016-08-26 LAB — CBC WITH DIFFERENTIAL/PLATELET
BASOS ABS: 0 10*3/uL (ref 0.0–0.1)
BASOS PCT: 0 %
EOS ABS: 0.2 10*3/uL (ref 0.0–0.7)
Eosinophils Relative: 2 %
HEMATOCRIT: 45.4 % (ref 39.0–52.0)
HEMOGLOBIN: 15.3 g/dL (ref 13.0–17.0)
Lymphocytes Relative: 20 %
Lymphs Abs: 2.2 10*3/uL (ref 0.7–4.0)
MCH: 30.5 pg (ref 26.0–34.0)
MCHC: 33.7 g/dL (ref 30.0–36.0)
MCV: 90.4 fL (ref 78.0–100.0)
MONOS PCT: 7 %
Monocytes Absolute: 0.7 10*3/uL (ref 0.1–1.0)
NEUTROS ABS: 7.8 10*3/uL — AB (ref 1.7–7.7)
NEUTROS PCT: 71 %
Platelets: 240 10*3/uL (ref 150–400)
RBC: 5.02 MIL/uL (ref 4.22–5.81)
RDW: 12.6 % (ref 11.5–15.5)
WBC: 11 10*3/uL — AB (ref 4.0–10.5)

## 2016-08-26 LAB — TSH: TSH: 2.422 u[IU]/mL (ref 0.350–4.500)

## 2016-08-26 LAB — COMPREHENSIVE METABOLIC PANEL
ALK PHOS: 72 U/L (ref 38–126)
ALT: 25 U/L (ref 17–63)
ANION GAP: 7 (ref 5–15)
AST: 27 U/L (ref 15–41)
Albumin: 4.1 g/dL (ref 3.5–5.0)
BUN: 10 mg/dL (ref 6–20)
CALCIUM: 9.4 mg/dL (ref 8.9–10.3)
CO2: 28 mmol/L (ref 22–32)
Chloride: 102 mmol/L (ref 101–111)
Creatinine, Ser: 1.19 mg/dL (ref 0.61–1.24)
Glucose, Bld: 95 mg/dL (ref 65–99)
Potassium: 4.1 mmol/L (ref 3.5–5.1)
SODIUM: 137 mmol/L (ref 135–145)
TOTAL PROTEIN: 7 g/dL (ref 6.5–8.1)
Total Bilirubin: 0.6 mg/dL (ref 0.3–1.2)

## 2016-08-26 LAB — RAPID URINE DRUG SCREEN, HOSP PERFORMED
AMPHETAMINES: NOT DETECTED
Barbiturates: NOT DETECTED
Benzodiazepines: NOT DETECTED
Cocaine: NOT DETECTED
OPIATES: NOT DETECTED
TETRAHYDROCANNABINOL: NOT DETECTED

## 2016-08-26 LAB — TROPONIN I: Troponin I: <0.03 ng/mL (ref <0.03)

## 2016-08-26 LAB — I-STAT TROPONIN, ED: Troponin i, poc: 0 ng/mL (ref 0.00–0.08)

## 2016-08-26 LAB — CK TOTAL AND CKMB (NOT AT ARMC)
CK, MB: 1.2 ng/mL (ref 0.5–5.0)
RELATIVE INDEX: 0.9 (ref 0.0–2.5)
Total CK: 137 U/L (ref 49–397)

## 2016-08-26 LAB — D-DIMER, QUANTITATIVE (NOT AT ARMC): D DIMER QUANT: 0.29 ug{FEU}/mL (ref 0.00–0.50)

## 2016-08-26 LAB — BRAIN NATRIURETIC PEPTIDE: B NATRIURETIC PEPTIDE 5: 18.3 pg/mL (ref 0.0–100.0)

## 2016-08-26 LAB — C-REACTIVE PROTEIN

## 2016-08-26 MED ORDER — ACETAMINOPHEN 650 MG RE SUPP: 650.0000 mg | Freq: Four times a day (QID) | RECTAL | Status: DC | PRN

## 2016-08-26 MED ORDER — COLCHICINE 0.6 MG PO TABS
0.6000 mg | ORAL_TABLET | Freq: Two times a day (BID) | ORAL | Status: DC
  Administered 2016-08-26 – 2016-08-27 (×3): 0.6 mg via ORAL
  Filled 2016-08-26 (×3): qty 1

## 2016-08-26 MED ORDER — HYDROCODONE-ACETAMINOPHEN 5-325 MG PO TABS
1.0000 | ORAL_TABLET | Freq: Four times a day (QID) | ORAL | Status: DC | PRN
  Administered 2016-08-26: 1 via ORAL
  Filled 2016-08-26: qty 1

## 2016-08-26 MED ORDER — PANTOPRAZOLE SODIUM 40 MG PO TBEC
40.0000 mg | DELAYED_RELEASE_TABLET | Freq: Every day | ORAL | Status: DC
  Administered 2016-08-26 – 2016-08-27 (×2): 40 mg via ORAL
  Filled 2016-08-26 (×2): qty 1

## 2016-08-26 MED ORDER — GUAIFENESIN ER 600 MG PO TB12
1200.0000 mg | ORAL_TABLET | Freq: Two times a day (BID) | ORAL | Status: DC
  Administered 2016-08-26 – 2016-08-27 (×2): 1200 mg via ORAL
  Filled 2016-08-26 (×2): qty 2

## 2016-08-26 MED ORDER — CARVEDILOL 3.125 MG PO TABS
3.1250 mg | ORAL_TABLET | Freq: Two times a day (BID) | ORAL | Status: DC
  Administered 2016-08-26: 3.125 mg via ORAL
  Filled 2016-08-26: qty 1

## 2016-08-26 MED ORDER — ONDANSETRON HCL 4 MG/2ML IJ SOLN: 4.0000 mg | Freq: Four times a day (QID) | INTRAMUSCULAR | Status: DC | PRN

## 2016-08-26 MED ORDER — ACETAMINOPHEN 325 MG PO TABS: 650.0000 mg | ORAL_TABLET | Freq: Four times a day (QID) | ORAL | Status: DC | PRN

## 2016-08-26 MED ORDER — ONDANSETRON HCL 4 MG PO TABS: 4.0000 mg | ORAL_TABLET | Freq: Four times a day (QID) | ORAL | Status: DC | PRN

## 2016-08-26 MED ORDER — LORATADINE 10 MG PO TABS
10.0000 mg | ORAL_TABLET | Freq: Every day | ORAL | Status: DC
  Administered 2016-08-26 – 2016-08-27 (×2): 10 mg via ORAL
  Filled 2016-08-26 (×2): qty 1

## 2016-08-26 MED ORDER — IBUPROFEN 600 MG PO TABS
600.0000 mg | ORAL_TABLET | Freq: Three times a day (TID) | ORAL | Status: DC
Start: 2016-08-26 — End: 2016-08-27
  Administered 2016-08-26 (×3): 600 mg via ORAL
  Filled 2016-08-26 (×4): qty 1

## 2016-08-26 NOTE — H&P (Signed)
History and Physical    Joshua Christensen YQM:[id number] DOB: 1991-07-06 DOA: 08/25/2016  PCP: No PCP Per Patient  Patient coming from: Home.  Chief Complaint: Shortness of breath.  HPI: Joshua Christensen is a 26 y.o. male with with recent admission for viral myocarditis discharged 3 days ago presents to the ER because of shortness of breath. Patient states since discharge his shortness of breath has been increasing. Denies any chest pain. Shortness of breath is present even at rest increases on exertion. Denies any productive cough fever or chills. Chest x-ray and EKG in the ER were unremarkable. D-dimer was negative. ER physician had discussed with on-call cardiologist who recommended admission for observation and repeating 2-D echo.   ED Course: Chest x-ray and EKG were unremarkable D level was negative troponin was negative.  Review of Systems: As per HPI, rest all negative.   Past Medical History:  Diagnosis Date  . Chest pain   . Gastroenteritis     Past Surgical History:  Procedure Laterality Date  . NO PAST SURGERIES    . WISDOM TOOTH EXTRACTION       reports that he has never smoked. He has never used smokeless tobacco. He reports that he does not drink alcohol or use drugs.  No Known Allergies  Family History  Problem Relation Age of Onset  . Healthy Mother   . CAD Father   . CVA Maternal Grandmother   . CAD Maternal Grandfather     Prior to Admission medications   Medication Sig Start Date End Date Taking? Authorizing Provider  carvedilol (COREG) 3.125 MG tablet Take 1 tablet (3.125 mg total) by mouth 2 (two) times daily with a meal. 08/23/16  Yes Ripudeep K Rai, MD  ibuprofen (ADVIL,MOTRIN) 600 MG tablet Take 1 tablet (600 mg total) by mouth 3 (three) times daily. X 2 weeks 08/23/16  Yes Ripudeep K Rai, MD  pantoprazole (PROTONIX) 40 MG tablet Take 1 tablet (40 mg total) by mouth daily. X 2 weeks, while on high dose ibuprofen 08/23/16  Yes Ripudeep Jenna LuoK Rai, MD     Physical Exam: Vitals:   08/26/16 0415 08/26/16 0430 08/26/16 0445 08/26/16 0500  BP: 113/71 100/57 113/65 109/70  Pulse: 63 (!) 57 (!) 55 (!) 56  Resp: 13 13 12 13   Temp:      TempSrc:      SpO2: 98% 97% 96% 97%  Weight:      Height:          Constitutional: Moderately built and nourished. Vitals:   08/26/16 0415 08/26/16 0430 08/26/16 0445 08/26/16 0500  BP: 113/71 100/57 113/65 109/70  Pulse: 63 (!) 57 (!) 55 (!) 56  Resp: 13 13 12 13   Temp:      TempSrc:      SpO2: 98% 97% 96% 97%  Weight:      Height:       Eyes: Anicteric no pallor. ENMT: No discharge from the ears eyes nose and mouth. Neck: No mass felt. No JVD appreciated. Respiratory: No rhonchi or crepitations. Cardiovascular: S1 and S2 heard no murmurs appreciated. Abdomen: Soft nontender bowel sounds present. No guarding or rigidity. Musculoskeletal: No edema. No joint effusion. Skin: No rash. Skin appears warm. Neurologic: Alert awake oriented to time place and person. Moves all extremities. Psychiatric: Appears normal. Normal affect.   Labs on Admission: I have personally reviewed following labs and imaging studies  CBC:  Recent Labs Lab 08/21/16 2333 08/21/16 2338 08/22/16 0642 08/25/16 2150  WBC  15.8*  --  11.9* 9.2  NEUTROABS 13.0*  --   --   --   HGB 15.9 16.0 15.0 15.6  HCT 45.7 47.0 45.4 45.4  MCV 90.0  --  92.7 89.4  PLT 260  --  225 285   Basic Metabolic Panel:  Recent Labs Lab 08/21/16 2338 08/22/16 0642 08/25/16 2150  NA 139 138 134*  K 3.9 4.0 4.0  CL 101 108 99*  CO2  --  23 24  GLUCOSE 90 87 102*  BUN 10 10 9   CREATININE 1.30* 1.19 1.12  CALCIUM  --  9.0 9.6   GFR: Estimated Creatinine Clearance: 87.7 mL/min (by C-G formula based on SCr of 1.12 mg/dL). Liver Function Tests: No results for input(s): AST, ALT, ALKPHOS, BILITOT, PROT, ALBUMIN in the last 168 hours. No results for input(s): LIPASE, AMYLASE in the last 168 hours. No results for input(s): AMMONIA  in the last 168 hours. Coagulation Profile: No results for input(s): INR, PROTIME in the last 168 hours. Cardiac Enzymes:  Recent Labs Lab 08/22/16 0258 08/22/16 0541 08/22/16 0732  TROPONINI <0.03 1.32* 0.03*   BNP (last 3 results) No results for input(s): PROBNP in the last 8760 hours. HbA1C: No results for input(s): HGBA1C in the last 72 hours. CBG: No results for input(s): GLUCAP in the last 168 hours. Lipid Profile: No results for input(s): CHOL, HDL, LDLCALC, TRIG, CHOLHDL, LDLDIRECT in the last 72 hours. Thyroid Function Tests: No results for input(s): TSH, T4TOTAL, FREET4, T3FREE, THYROIDAB in the last 72 hours. Anemia Panel: No results for input(s): VITAMINB12, FOLATE, FERRITIN, TIBC, IRON, RETICCTPCT in the last 72 hours. Urine analysis:    Component Value Date/Time   COLORURINE YELLOW 07/26/2016 0341   APPEARANCEUR CLEAR 07/26/2016 0341   LABSPEC 1.021 07/26/2016 0341   PHURINE 6.0 07/26/2016 0341   GLUCOSEU NEGATIVE 07/26/2016 0341   HGBUR NEGATIVE 07/26/2016 0341   BILIRUBINUR NEGATIVE 07/26/2016 0341   KETONESUR NEGATIVE 07/26/2016 0341   PROTEINUR NEGATIVE 07/26/2016 0341   NITRITE NEGATIVE 07/26/2016 0341   LEUKOCYTESUR NEGATIVE 07/26/2016 0341   Sepsis Labs: @LABRCNTIP (procalcitonin:4,lacticidven:4) ) Recent Results (from the past 240 hour(s))  Rapid strep screen (not at [facility])     Status: None   Collection Time: 08/22/16  6:19 AM  Result Value Ref Range Status   Streptococcus, Group A Screen (Direct) NEGATIVE NEGATIVE Final    Comment: (NOTE) A Rapid Antigen test may result negative if the antigen level in the sample is below the detection level of this test. The FDA has not cleared this test as a stand-alone test therefore the rapid antigen negative result has reflexed to a Group A Strep culture.   Culture, group A strep     Status: None   Collection Time: 08/22/16  6:19 AM  Result Value Ref Range Status   Specimen Description THROAT  Final    Special Requests NONE Reflexed from [id number]  Final   Culture NO GROUP A STREP (S.PYOGENES) ISOLATED  Final   Report Status 08/24/2016 FINAL  Final     Radiological Exams on Admission: Dg Chest 2 View  Result Date: 08/25/2016 CLINICAL DATA:  Acute onset of shortness of breath. Initial encounter. EXAM: CHEST  2 VIEW COMPARISON:  Chest radiograph performed 08/22/2015 FINDINGS: The lungs are well-aerated and clear. There is no evidence of focal opacification, pleural effusion or pneumothorax. The heart is normal in size; the mediastinal contour is within normal limits. No acute osseous abnormalities are seen. IMPRESSION: No acute cardiopulmonary process  seen. Electronically Signed   By: Roanna Raider M.D.   On: 08/25/2016 22:17    EKG: Independently reviewed. Normal sinus rhythm with RSR pattern.  Assessment/Plan Principal Problem:   Acute respiratory failure with hypoxia (HCC) Active Problems:   Myocarditis (HCC)    1. Acute respiratory failure with hypoxia with recent myocarditis - patient denies any chest pain at this time. D-dimer was negative. I have requested cardiology consult. I have ordered repeat 2-D echo to check for EF. Continue Coreg ibuprofen and Protonix.   DVT prophylaxis: SCDs for now. Code Status: Full code.  Family Communication: Patient's mother.  Disposition Plan: Home.  Consults called: Cardiology consult requested.  Admission status: Observation.    Eduard Clos MD [facility] Pager [phone].  If 7PM-7AM, please contact night-coverage [email or web address] Password [email or web address]  08/26/2016, 6:02 AM

## 2016-08-26 NOTE — ED Notes (Signed)
Report called and given to Nikki on 6E 

## 2016-08-26 NOTE — Progress Notes (Signed)
PROGRESS NOTE                                                                                                                                                                                                             Patient Demographics:    Joshua Christensen, is a 26 y.o. male, DOB - [birth year], ZOX:[id number]  Admit date - 08/25/2016   Admitting Physician Eduard Clos, MD  Outpatient Primary MD for the patient is No PCP Per Patient  LOS - 1   Chief Complaint  Patient presents with  . Shortness of Breath       Brief Narrative   26 y.o. male with with recent admission for viral myocarditis discharged 3 days ago presents to the ER because of shortness of breath   Subjective:    Joshua Christensen today has, No headache, He reports dyspnea, .    Assessment  & Plan :    Principal Problem:   Acute respiratory failure with hypoxia (HCC) Active Problems:   Myocarditis (HCC)   Dyspnea - Patient presents with dyspnea, recent history of acute viral myocarditis, no evidence of volume overload, respiratory distress, no wheezing, unclear etiology. - Follow on 2-D echo - Discussed with cardiology, rule out colchicine to his current regimen, started on hydrocodone when necessary by cardiology as felt maybe some splinting until bleeding from chest pain causing his subjective dyspnea - D-dimer is within normal limits  Acute myocarditis - Recently discharged, mild case on cardiac MRI, normal EF, await repeat echo, ibuprofen, PPI, obstructive colchicine, cardiology stopped beta blocker      Code Status : Full  Family Communication  : mother at bedside  Disposition Plan  : home when stable  Consults  :  Cardiology  Procedures  : none  DVT Prophylaxis  :   SCDs   Lab Results  Component Value Date   PLT 240 08/26/2016    Antibiotics  :    Anti-infectives    None        Objective:   Vitals:   08/26/16 0605 08/26/16 0842  08/26/16 0902 08/26/16 0906  BP: 113/68 117/62 126/74   Pulse: 60 80 98   Resp: 14  16   Temp: 97.4 F (36.3 C)  97.2 F (36.2 C)   TempSrc: Oral  Oral   SpO2: 100% 100% 100%  97%  Weight: 63.1 kg (139 lb 1.6 oz)     Height: 5\' 5"  (1.651 m)       Wt Readings from Last 3 Encounters:  08/26/16 63.1 kg (139 lb 1.6 oz)  08/23/16 62.5 kg (137 lb 12.8 oz)  07/26/16 64.9 kg (143 lb)     Intake/Output Summary (Last 24 hours) at 08/26/16 1525 Last data filed at 08/26/16 0906  Gross per 24 hour  Intake              120 ml  Output                0 ml  Net              120 ml     Physical Exam  Awake Alert, Oriented X 3, No new F.N deficits, Normal affect [place].AT,PERRAL Supple Neck,No JVD, No cervical lymphadenopathy appriciated.  Symmetrical Chest wall movement, Good air movement bilaterally, CTAB RRR,No Gallops,Rubs or new Murmurs, No Parasternal Heave +ve B.Sounds, Abd Soft, No tenderness, No organomegaly appriciated, No rebound - guarding or rigidity. No Cyanosis, Clubbing or edema, No new Rash or bruise      Data Review:    CBC  Recent Labs Lab 08/21/16 2333 08/21/16 2338 08/22/16 0642 08/25/16 2150 08/26/16 0605  WBC 15.8*  --  11.9* 9.2 11.0*  HGB 15.9 16.0 15.0 15.6 15.3  HCT 45.7 47.0 45.4 45.4 45.4  PLT 260  --  225 285 240  MCV 90.0  --  92.7 89.4 90.4  MCH 31.3  --  30.6 30.7 30.5  MCHC 34.8  --  33.0 34.4 33.7  RDW 12.3  --  12.8 12.2 12.6  LYMPHSABS 1.5  --   --   --  2.2  MONOABS 1.2*  --   --   --  0.7  EOSABS 0.1  --   --   --  0.2  BASOSABS 0.0  --   --   --  0.0    Chemistries   Recent Labs Lab 08/21/16 2338 08/22/16 0642 08/25/16 2150 08/26/16 0605  NA 139 138 134* 137  K 3.9 4.0 4.0 4.1  CL 101 108 99* 102  CO2  --  23 24 28   GLUCOSE 90 87 102* 95  BUN 10 10 9 10   CREATININE 1.30* 1.19 1.12 1.19  CALCIUM  --  9.0 9.6 9.4  AST  --   --   --  27  ALT  --   --   --  25  ALKPHOS  --   --   --  72  BILITOT  --   --   --  0.6    ------------------------------------------------------------------------------------------------------------------ No results for input(s): CHOL, HDL, LDLCALC, TRIG, CHOLHDL, LDLDIRECT in the last 72 hours.  No results found for: HGBA1C ------------------------------------------------------------------------------------------------------------------  Recent Labs  08/26/16 0605  TSH 2.422   ------------------------------------------------------------------------------------------------------------------ No results for input(s): VITAMINB12, FOLATE, FERRITIN, TIBC, IRON, RETICCTPCT in the last 72 hours.  Coagulation profile No results for input(s): INR, PROTIME in the last 168 hours.   Recent Labs  08/26/16 0139  DDIMER 0.29    Cardiac Enzymes  Recent Labs Lab 08/22/16 0732 08/26/16 0605 08/26/16 1051  CKMB  --  1.2  --   TROPONINI 0.03* <0.03 <0.03   ------------------------------------------------------------------------------------------------------------------    Component Value Date/Time   BNP 18.3 08/26/2016 0605    Inpatient Medications  Scheduled Meds: . colchicine  0.6 mg Oral BID  . ibuprofen  600  mg Oral TID  . pantoprazole  40 mg Oral Daily   Continuous Infusions: PRN Meds:.acetaminophen **OR** acetaminophen, HYDROcodone-acetaminophen, ondansetron **OR** ondansetron (ZOFRAN) IV  Micro Results Recent Results (from the past 240 hour(s))  Rapid strep screen (not at [facility])     Status: None   Collection Time: 08/22/16  6:19 AM  Result Value Ref Range Status   Streptococcus, Group A Screen (Direct) NEGATIVE NEGATIVE Final    Comment: (NOTE) A Rapid Antigen test may result negative if the antigen level in the sample is below the detection level of this test. The FDA has not cleared this test as a stand-alone test therefore the rapid antigen negative result has reflexed to a Group A Strep culture.   Culture, group A strep     Status: None    Collection Time: 08/22/16  6:19 AM  Result Value Ref Range Status   Specimen Description THROAT  Final   Special Requests NONE Reflexed from [id number]  Final   Culture NO GROUP A STREP (S.PYOGENES) ISOLATED  Final   Report Status 08/24/2016 FINAL  Final    Radiology Reports Dg Chest 2 View  Result Date: 08/25/2016 CLINICAL DATA:  Acute onset of shortness of breath. Initial encounter. EXAM: CHEST  2 VIEW COMPARISON:  Chest radiograph performed 08/22/2015 FINDINGS: The lungs are well-aerated and clear. There is no evidence of focal opacification, pleural effusion or pneumothorax. The heart is normal in size; the mediastinal contour is within normal limits. No acute osseous abnormalities are seen. IMPRESSION: No acute cardiopulmonary process seen. Electronically Signed   By: Roanna RaiderJeffery  Chang M.D.   On: 08/25/2016 22:17   Dg Chest 2 View  Result Date: 08/21/2016 CLINICAL DATA:  Dyspnea with upper respiratory symptoms starting 1 week ago. EXAM: CHEST  2 VIEW COMPARISON:  03/01/2012 FINDINGS: The heart size and mediastinal contours are within normal limits. Both lungs are clear. The visualized skeletal structures are unremarkable. IMPRESSION: No active cardiopulmonary disease. Electronically Signed   By: Tollie Ethavid  Kwon M.D.   On: 08/21/2016 20:45   Mr Cardiac Morphology W Wo Contrast  Result Date: 08/22/2016 CLINICAL DATA:  26 year old with atypical chest pain, elevated troponin and positive mononucleosis screen. EXAM: CARDIAC MRI TECHNIQUE: The patient was scanned on a 1.5 Tesla GE magnet. A dedicated cardiac coil was used. Functional imaging was done using Fiesta sequences. 2,3, and 4 chamber views were done to assess for RWMA's. Modified Simpson's rule using a short axis stack was used to calculate an ejection fraction on a dedicated work Research officer, trade unionstation using Circle software. The patient received 24 cc of Multihance. After 10 minutes inversion recovery sequences were used to assess for infiltration and scar tissue.  CONTRAST:  24 cc  of Multihance FINDINGS: 1. Normal left ventricular size, thickness and systolic function (LVEF = 58%) with no regional wall motion abnormalities. There is mild diffuse mid epicardial late gadolinium enhancement in the mid anterior, anteroseptal, apical septal and anterior walls. LVEDD:  54 mm LVESD:  38 mm LVEDV:  147 ml LVESV; 62 ml SV:  86 ml CO:  5.0 L/min Myocardial mass:  97 g 2. Normal right ventricular size, thickness and systolic function (LVEF = 55%) with no regional wall motion abnormalities. RVEDV:  155 ml RVESV:  70 ml SV:  85 ml CO:  4.9 L/min 3.  Normal biatrial size. 4.  Mild tricuspid regurgitation. 5. Normal size of the aortic root and thoracic aorta. 6.  Normal pericardium, no pericardial effusion. IMPRESSION: 1. Normal left ventricular size, thickness  and systolic function (LVEF = 58%) with no regional wall motion abnormalities. There is mild diffuse mid epicardial late gadolinium enhancement in the mid anterior, anteroseptal, apical septal and anterior walls. 2. Normal right ventricular size, thickness and systolic function (LVEF = 55%) with no regional wall motion abnormalities. 3.  Normal biatrial size. 4.  Mild tricuspid regurgitation. 5. Normal size of the aortic root and thoracic aorta. 6.  Normal pericardium, no pericardial effusion. 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 No evidence for pericarditis. Tobias Alexander Electronically Signed   By: Tobias Alexander   On: 08/22/2016 15:29     Randol Kern, DAWOOD M.D on 08/26/2016 at 3:25 PM  Between 7am to 7pm - Pager - [phone]  After 7pm go to [email or web address] - password [facility]  [facility] -  Office  [phone]

## 2016-08-26 NOTE — ED Notes (Signed)
Pt placed on 2L [place]

## 2016-08-26 NOTE — Consult Note (Signed)
CARDIOLOGY CONSULT NOTE   Patient ID: Joshua Christensen MRN: [id number] DOB/AGE: [birth year] 26 y.o.  Admit date: 08/25/2016  Primary Physician   No PCP Per Patient Primary Cardiologist   Dr. Anne Fu Reason for Consultation   Myocarditis/SOB Requesting Physician  Dr. Randol Kern  HPI: Joshua Christensen is a 26 y.o. male  with a history of no past medical history who presently admitted 08/23/15-08/24/15 for acute viral Myocarditis presents 08/26/15 for SOB.   He was seen in the emergency department on 07/26/16 for generalized abdominal pain with vomiting. WBC was elevated at 15.0. CT of the abdomen showed possible enteritis. This was felt to be viral so he was discharged home with no antibiotics. He eventually recovered from this and was feeling well.   Came to ER 08/23/15 for right-sided chest pain. Described as pressure as well as sharp. First troponin I was negative however second troponin I returned at 1.32. Admitted for further work up. Echo showed normal EF of 55-60%, no WM abnormality. Cardiac MRI showed mild acute myocarditis. Likely 2/2 to mononucleosis Patient was treated with coreg and Ibuprofen 600mg  TID x 2 weeks and discharged home next day.  She continued to have shortness of breath that has gotten worse. Intermittent chest pressure. Yesterday had worse episode of worse episode of dyspnea with exertion associated with dizziness leading to ER presentation. Compliant with medications. EKG showed NSR at rate of 71 bpm. No acute changes. CXR clear. Electrolytes and Scr normal. Troponin normal. D-dimer negative. WBC increased today to 11 from 9.2. TSH normal. Pending echo. Telemetry sinus rhythm at rate of 50s with PACs. No dyspnea at rest.   He does not drink and is a never smoker. He denies any illicit drug use. Family history of CAD in his father and maternal grandfather.  Past Medical History:  Diagnosis Date  . Chest pain   . Gastroenteritis      Past Surgical History:  Procedure  Laterality Date  . NO PAST SURGERIES    . WISDOM TOOTH EXTRACTION      No Known Allergies  I have reviewed the patient's current medications . carvedilol  3.125 mg Oral BID WC  . ibuprofen  600 mg Oral TID  . pantoprazole  40 mg Oral Daily    acetaminophen **OR** acetaminophen, ondansetron **OR** ondansetron (ZOFRAN) IV  Prior to Admission medications   Medication Sig Start Date End Date Taking? Authorizing Provider  carvedilol (COREG) 3.125 MG tablet Take 1 tablet (3.125 mg total) by mouth 2 (two) times daily with a meal. 08/23/16  Yes Ripudeep K Rai, MD  ibuprofen (ADVIL,MOTRIN) 600 MG tablet Take 1 tablet (600 mg total) by mouth 3 (three) times daily. X 2 weeks 08/23/16  Yes Ripudeep K Rai, MD  pantoprazole (PROTONIX) 40 MG tablet Take 1 tablet (40 mg total) by mouth daily. X 2 weeks, while on high dose ibuprofen 08/23/16  Yes Ripudeep Jenna Luo, MD     Social History   Social History  . Marital status: Single    Spouse name: N/A  . Number of children: N/A  . Years of education: N/A   Occupational History  . Not on file.   Social History Main Topics  . Smoking status: Never Smoker  . Smokeless tobacco: Never Used  . Alcohol use No  . Drug use: No  . Sexual activity: Not on file   Other Topics Concern  . Not on file   Social History Narrative  . No narrative on file  Family Status  Relation Status  . Mother Alive  . Father   . Maternal Grandmother   . Maternal Grandfather    Family History  Problem Relation Age of Onset  . Healthy Mother   . CAD Father   . CVA Maternal Grandmother   . CAD Maternal Grandfather      ROS:  Full 14 point review of systems complete and found to be negative unless listed above.  Physical Exam: Blood pressure 126/74, pulse 98, temperature 97.2 F (36.2 C), temperature source Oral, resp. rate 16, height 5\' 5"  (1.651 m), weight 139 lb 1.6 oz (63.1 kg), SpO2 97 %.  General: Well developed, well nourished, male in no acute  distress Head: Eyes PERRLA, No xanthomas. Normocephalic and atraumatic, oropharynx without edema or exudate.  Lungs: Resp regular and unlabored, CTA. Heart: RRR no s3, s4, or murmurs..   Neck: No carotid bruits. No lymphadenopathy.  No JVD. Abdomen: Bowel sounds present, abdomen soft and non-tender without masses or hernias noted. Msk:  No spine or cva tenderness. No weakness, no joint deformities or effusions. Extremities: No clubbing, cyanosis or edema. DP/PT/Radials 2+ and equal bilaterally. Neuro: Alert and oriented X 3. No focal deficits noted. Psych:  Good affect, responds appropriately Skin: No rashes or lesions noted.  Labs:   Lab Results  Component Value Date   WBC 11.0 (H) 08/26/2016   HGB 15.3 08/26/2016   HCT 45.4 08/26/2016   MCV 90.4 08/26/2016   PLT 240 08/26/2016   No results for input(s): INR in the last 72 hours.  Recent Labs Lab 08/26/16 0605  NA 137  K 4.1  CL 102  CO2 28  BUN 10  CREATININE 1.19  CALCIUM 9.4  PROT 7.0  BILITOT 0.6  ALKPHOS 72  ALT 25  AST 27  GLUCOSE 95  ALBUMIN 4.1   No results found for: MG  Recent Labs  08/26/16 0605  CKTOTAL 137  CKMB 1.2  TROPONINI <0.03    Recent Labs  08/25/16 2205 08/26/16 0148  TROPIPOC 0.00 0.00   No results found for: PROBNP No results found for: CHOL, HDL, LDLCALC, TRIG Lab Results  Component Value Date   DDIMER 0.29 08/26/2016   Lipase  Date/Time Value Ref Range Status  07/26/2016 12:55 AM 18 11 - 51 U/L Final   TSH  Date/Time Value Ref Range Status  08/26/2016 06:05 AM 2.422 0.350 - 4.500 uIU/mL Final    Comment:    Performed by a 3rd Generation assay with a functional sensitivity of <=0.01 uIU/mL.   No results found for: VITAMINB12, FOLATE, FERRITIN, TIBC, IRON, RETICCTPCT  Cardiac MR 08/23/15 IMPRESSION: 1. Normal left ventricular size, thickness and systolic function (LVEF = 58%) with no regional wall motion abnormalities. There is mild diffuse mid epicardial late  gadolinium enhancement in the mid anterior, anteroseptal, apical septal and anterior walls. 2. Normal right ventricular size, thickness and systolic function (LVEF = 55%) with no regional wall motion abnormalities. 3. Normal biatrial size. 4. Mild tricuspid regurgitation. 5. Normal size of the aortic root and thoracic aorta. 6. Normal pericardium, no pericardial effusion. 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 No evidence for pericarditis.   2D ECHO: 08/22/2016 LV EF: 55% - 60% Study Conclusions - Left ventricle: The cavity size was normal. Systolic function was normal. The estimated ejection fraction was in the range of 55% to 60%. Wall motion was normal; there were no regional wall  motion abnormalities. Left ventricular diastolic function parameters were normal. - Aortic valve: Trileaflet; normal thickness leaflets. There was no regurgitation. - Aortic root: The aortic root was normal in size. - Mitral valve: There was no regurgitation. - Left atrium: The atrium was normal in size. - Right ventricle: The cavity size was normal. Wall thickness was normal. Systolic function was normal. - Right atrium: The atrium was normal in size. - Tricuspid valve: There was trivial regurgitation. - Pulmonic valve: There was no regurgitation. - Pulmonary arteries: Systolic pressure was within the normal range. - Inferior vena cava: The vessel was normal in size. - Pericardium, extracardiac: There was no pericardial effusion. Impressions: - Normal study.  Radiology:  Dg Chest 2 View  Result Date: 08/25/2016 CLINICAL DATA:  Acute onset of shortness of breath. Initial encounter. EXAM: CHEST  2 VIEW COMPARISON:  Chest radiograph performed 08/22/2015 FINDINGS: The lungs are well-aerated and clear. There is no evidence of focal opacification, pleural effusion or pneumothorax. The  heart is normal in size; the mediastinal contour is within normal limits. No acute osseous abnormalities are seen. IMPRESSION: No acute cardiopulmonary process seen. Electronically Signed   By: Roanna RaiderJeffery  Chang M.D.   On: 08/25/2016 22:17    ASSESSMENT AND PLAN:     1. Acute Myocarditis - Mild case on cardiac MRI. LVEF normal on echo. Likely viral etiology. Now presenting with worsening dyspnea with exertion. Normal CPR.  Pending echo this admission. Continue current medication.   SignedManson Passey: Tailer Volkert, PA 08/26/2016, 9:49 AM  Co-Sign MD

## 2016-08-26 NOTE — Progress Notes (Signed)
Patient ambulated entire length of hall and back, although c/o SOB sats remained >90% and heart rate remained in the 90's. Patient encouraged to get out of bed and walk as much as possible.

## 2016-08-26 NOTE — ED Provider Notes (Signed)
[facility] DEPT Provider Note   CSN: [id number] Arrival date & time: 08/25/16  2130     History   Chief Complaint Chief Complaint  Patient presents with  . Shortness of Breath    HPI Joshua Christensen is a 26 y.o. male.  Patient presents with complaint of SOB. 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 He states the chest pain he experienced has resolved but SOB is worse. No cough or fever. He does not feel sick, and has a normal appetite. He has been taking the prescribed medications since discharge which include Coreg, Protonix and ibuprofen.    The history is provided by the patient and a parent. No language interpreter was used.  Shortness of Breath  Pertinent negatives include no fever, no cough, no chest pain, no vomiting and no abdominal pain.    Past Medical History:  Diagnosis Date  . Chest pain   . Gastroenteritis     Patient Active Problem List   Diagnosis Date Noted  . Cellulitis 08/26/2016  . Chest pain 08/22/2016  . 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 Renal insufficiency 08/22/2016  . Bradycardia 08/22/2016    Past Surgical History:  Procedure Laterality Date  . NO PAST SURGERIES    . WISDOM TOOTH EXTRACTION         Home Medications    Prior to Admission medications   Medication Sig Start Date End Date Taking? Authorizing Provider  carvedilol (COREG) 3.125 MG tablet Take 1 tablet (3.125 mg total) by mouth 2 (two) times daily with a meal. 08/23/16  Yes Ripudeep K Rai, MD  ibuprofen (ADVIL,MOTRIN) 600 MG tablet Take 1 tablet (600 mg total) by mouth 3 (three) times daily. X 2 weeks 08/23/16  Yes Ripudeep K Rai, MD  pantoprazole (PROTONIX) 40 MG tablet Take 1 tablet (40 mg total) by mouth daily. X 2 weeks, while on high dose ibuprofen 08/23/16  Yes Ripudeep Jenna Luo, MD    Family History Family History  Problem Relation Age of Onset  . Healthy Mother   .  CAD Father   . CVA Maternal Grandmother   . CAD Maternal Grandfather     Social History Social History  Substance Use Topics  . Smoking status: Never Smoker  . Smokeless tobacco: Never Used  . Alcohol use No     Allergies   Patient has no known allergies.   Review of Systems Review of Systems  Constitutional: Negative for chills and fever.  HENT: Negative.  Negative for congestion.   Respiratory: Positive for shortness of breath. Negative for cough.   Cardiovascular: Negative.  Negative for chest pain.  Gastrointestinal: Negative.  Negative for abdominal pain, nausea and vomiting.  Musculoskeletal: Negative.   Neurological: Negative.  Negative for syncope, weakness and light-headedness.     Physical Exam Updated Vital Signs BP 104/57   Pulse 66   Temp 97.4 F (36.3 C) (Oral)   Resp 14   Ht 5\' 5"  (1.651 m)   Wt 63 kg   SpO2 100%   BMI 23.13 kg/m   Physical Exam  Constitutional: He is oriented to person, place, and time. He appears well-developed and well-nourished.  HENT:  Head: Normocephalic.  Neck: Normal range of motion. Neck supple.  Cardiovascular: Normal rate and regular rhythm.   No murmur heard. Pulmonary/Chest: Effort normal and breath sounds normal. He has no wheezes. He has no rales.  Abdominal: Soft. Bowel sounds are  normal. There is no tenderness. There is no rebound and no guarding.  Musculoskeletal: Normal range of motion.  Neurological: He is alert and oriented to person, place, and time.  Skin: Skin is warm and dry. No rash noted.  Psychiatric: He has a normal mood and affect.     ED Treatments / Results  Labs (all labs ordered are listed, but only abnormal results are displayed) Labs Reviewed  BASIC METABOLIC PANEL - Abnormal; Notable for the following:       Result Value   Sodium 134 (*)    Chloride 99 (*)    Glucose, Bld 102 (*)    All other components within normal limits  CBC  D-DIMER, QUANTITATIVE (NOT AT [facility])  I-STAT  TROPOININ, ED  I-STAT TROPOININ, ED  I-STAT TROPOININ, ED   Results for orders placed or performed during the hospital encounter of 08/25/16  Basic metabolic panel  Result Value Ref Range   Sodium 134 (L) 135 - 145 mmol/L   Potassium 4.0 3.5 - 5.1 mmol/L   Chloride 99 (L) 101 - 111 mmol/L   CO2 24 22 - 32 mmol/L   Glucose, Bld 102 (H) 65 - 99 mg/dL   BUN 9 6 - 20 mg/dL   Creatinine, Ser 4.09 0.61 - 1.24 mg/dL   Calcium 9.6 8.9 - 81.1 mg/dL   GFR calc non Af Amer >60 >60 mL/min   GFR calc Af Amer >60 >60 mL/min   Anion gap 11 5 - 15  CBC  Result Value Ref Range   WBC 9.2 4.0 - 10.5 K/uL   RBC 5.08 4.22 - 5.81 MIL/uL   Hemoglobin 15.6 13.0 - 17.0 g/dL   HCT 91.4 78.2 - 95.6 %   MCV 89.4 78.0 - 100.0 fL   MCH 30.7 26.0 - 34.0 pg   MCHC 34.4 30.0 - 36.0 g/dL   RDW 21.3 08.6 - 57.8 %   Platelets 285 150 - 400 K/uL  D-dimer, quantitative (not at [facility])  Result Value Ref Range   D-Dimer, Quant 0.29 0.00 - 0.50 ug/mL-FEU  I-stat troponin, ED  Result Value Ref Range   Troponin i, poc 0.00 0.00 - 0.08 ng/mL   Comment 3          I-stat troponin, ED  Result Value Ref Range   Troponin i, poc 0.00 0.00 - 0.08 ng/mL   Comment 3          I-stat troponin, ED  Result Value Ref Range   Troponin i, poc 0.00 0.00 - 0.08 ng/mL   Comment 3            EKG  EKG Interpretation  Date/Time:  Sunday August 25 2016 21:41:09 EST Ventricular Rate:  73 PR Interval:  148 QRS Duration: 96 QT Interval:  360 QTC Calculation: 396 R Axis:   77 Text Interpretation:  Normal sinus rhythm RSR' or QR pattern in V1 suggests right ventricular conduction delay Borderline ECG No significant change since last tracing Confirmed by HORTON  MD, Toni Amend ([id number]) on 08/25/2016 11:08:22 PM       Radiology Dg Chest 2 View  Result Date: 08/25/2016 CLINICAL DATA:  Acute onset of shortness of breath. Initial encounter. EXAM: CHEST  2 VIEW COMPARISON:  Chest radiograph performed 08/22/2015 FINDINGS: The lungs are  well-aerated and clear. There is no evidence of focal opacification, pleural effusion or pneumothorax. The heart is normal in size; the mediastinal contour is within normal limits. No acute osseous abnormalities are seen. IMPRESSION:  No acute cardiopulmonary process seen. Electronically Signed   By: Roanna RaiderJeffery  Chang M.D.   On: 08/25/2016 22:17    Procedures Procedures (including critical care time)  Medications Ordered in ED Medications - No data to display   Initial Impression / Assessment and Plan / ED Course  I have reviewed the triage vital signs and the nursing notes.  Pertinent labs & imaging results that were available during my care of the patient were reviewed by me and considered in my medical decision making (see chart for details).  Clinical Course     Patient presents with persistent SOB after admission for myocarditis last week. No further chest pain. He states that with any exertion he becomes SOB and profoundly SOB with walking even short distances. He is found to desaturate to 85-86% when walking and is symptomatic. No pain with hypoxia. D-dimer negative. No leukocytosis.  Chart reviewed. Discussed patient's condition with cardiology who recommends inpatient evaluation of source of hypoxia, possibly a second ECHO (1st was normal, EF 55-60%). [facility].   ADDENDUM: [facility] was erroneously entered on this patient prior to actual admission. Records were corrected. The diagnosis entered on this chart of Cellulitis of left LE is incorrect.   Final Clinical Impressions(s) / ED Diagnoses   Final diagnoses:  Cellulitis of left lower extremity   1. Hypoxia  New Prescriptions New Prescriptions   No medications on file     Elpidio AnisShari Delecia Vastine, PA-C 08/26/16 0650    Layla MawKristen N Ward, DO 08/26/16 [phone]

## 2016-08-27 DIAGNOSIS — J9601 Acute respiratory failure with hypoxia: Secondary | ICD-10-CM

## 2016-08-27 DIAGNOSIS — A3952 Meningococcal myocarditis: Secondary | ICD-10-CM | POA: Diagnosis not present

## 2016-08-27 LAB — BASIC METABOLIC PANEL
Anion gap: 7 (ref 5–15)
BUN: 11 mg/dL (ref 6–20)
CALCIUM: 9.2 mg/dL (ref 8.9–10.3)
CO2: 26 mmol/L (ref 22–32)
CREATININE: 1.1 mg/dL (ref 0.61–1.24)
Chloride: 104 mmol/L (ref 101–111)
GFR calc Af Amer: >60 mL/min (ref >60)
GFR calc non Af Amer: >60 mL/min (ref >60)
GLUCOSE: 79 mg/dL (ref 65–99)
Potassium: 3.6 mmol/L (ref 3.5–5.1)
Sodium: 137 mmol/L (ref 135–145)

## 2016-08-27 LAB — CBC
HEMATOCRIT: 43.1 % (ref 39.0–52.0)
Hemoglobin: 14.8 g/dL (ref 13.0–17.0)
MCH: 30.9 pg (ref 26.0–34.0)
MCHC: 34.3 g/dL (ref 30.0–36.0)
MCV: 90 fL (ref 78.0–100.0)
Platelets: 240 10*3/uL (ref 150–400)
RBC: 4.79 MIL/uL (ref 4.22–5.81)
RDW: 12.4 % (ref 11.5–15.5)
WBC: 8.7 10*3/uL (ref 4.0–10.5)

## 2016-08-27 MED ORDER — INDOMETHACIN 50 MG PO CAPS
50.0000 mg | ORAL_CAPSULE | Freq: Two times a day (BID) | ORAL | Status: DC
  Filled 2016-08-27: qty 1

## 2016-08-27 MED ORDER — PANTOPRAZOLE SODIUM 40 MG PO TBEC: 40.0000 mg | DELAYED_RELEASE_TABLET | Freq: Every day | ORAL | 0 refills | Status: DC

## 2016-08-27 MED ORDER — HYDROCODONE-ACETAMINOPHEN 5-325 MG PO TABS: 1.0000 | ORAL_TABLET | Freq: Four times a day (QID) | ORAL | 0 refills | Status: DC | PRN

## 2016-08-27 MED ORDER — INDOMETHACIN 50 MG PO CAPS: 50.0000 mg | ORAL_CAPSULE | Freq: Two times a day (BID) | ORAL | 0 refills | Status: DC

## 2016-08-27 MED ORDER — COLCHICINE 0.6 MG PO TABS: 0.6000 mg | ORAL_TABLET | Freq: Two times a day (BID) | ORAL | 0 refills | Status: DC

## 2016-08-27 NOTE — Discharge Instructions (Signed)
Follow with Primary MD  in 7 days   Get CBC, CMP,   by Primary MD next visit.    Activity: As tolerated with Full fall precautions use walker/cane & assistance as needed   Disposition Home    Diet: Regular diet   On your next visit with your primary care physician please Get Medicines reviewed and adjusted.   Please request your Prim.MD to go over all Hospital Tests and Procedure/Radiological results at the follow up, please get all Hospital records sent to your Prim MD by signing hospital release before you go home.   If you experience worsening of your admission symptoms, develop shortness of breath, life threatening emergency, suicidal or homicidal thoughts you must seek medical attention immediately by calling 911 or calling your MD immediately  if symptoms less severe.  You Must read complete instructions/literature along with all the possible adverse reactions/side effects for all the Medicines you take and that have been prescribed to you. Take any new Medicines after you have completely understood and accpet all the possible adverse reactions/side effects.   Do not drive, operating heavy machinery, perform activities at heights, swimming or participation in water activities or provide baby sitting services if your were admitted for syncope or siezures until you have seen by Primary MD or a Neurologist and advised to do so again.  Do not drive when taking Pain medications.    Do not take more than prescribed Pain, Sleep and Anxiety Medications  Special Instructions: If you have smoked or chewed Tobacco  in the last 2 yrs please stop smoking, stop any regular Alcohol  and or any Recreational drug use.  Wear Seat belts while driving.   Please note  You were cared for by a hospitalist during your hospital stay. If you have any questions about your discharge medications or the care you received while you were in the hospital after you are discharged, you can call the unit and  asked to speak with the hospitalist on call if the hospitalist that took care of you is not available. Once you are discharged, your primary care physician will handle any further medical issues. Please note that NO REFILLS for any discharge medications will be authorized once you are discharged, as it is imperative that you return to your primary care physician (or establish a relationship with a primary care physician if you do not have one) for your aftercare needs so that they can reassess your need for medications and monitor your lab values.

## 2016-08-27 NOTE — Progress Notes (Signed)
Patient Name: Joshua Christensen Date of Encounter: 08/27/2016  Primary Cardiologist: Dr. [facility] Problem List     Principal Problem:   Acute respiratory failure with hypoxia [facility]) Active Problems:   Myocarditis (HCC)     Subjective   Feeling better.  Shortness of breath much better but still present.  Able to ambulate the halls.   Inpatient Medications    Scheduled Meds: . colchicine  0.6 mg Oral BID  . guaiFENesin  1,200 mg Oral BID  . ibuprofen  600 mg Oral TID  . loratadine  10 mg Oral Daily  . pantoprazole  40 mg Oral Daily   Continuous Infusions:  PRN Meds: acetaminophen **OR** acetaminophen, HYDROcodone-acetaminophen, ondansetron **OR** ondansetron (ZOFRAN) IV   Vital Signs    Vitals:   08/26/16 1734 08/26/16 2109 08/27/16 0416 08/27/16 0845  BP: 118/62 118/62 107/63 113/65  Pulse: (!) 59 63 66 61  Resp: 16 18 18 18   Temp: 97.7 F (36.5 C) 98 F (36.7 C) 97.9 F (36.6 C) 97.4 F (36.3 C)  TempSrc: Oral Oral Oral Oral  SpO2: 98% 98% 100% 100%  Weight:      Height:        Intake/Output Summary (Last 24 hours) at 08/27/16 1012 Last data filed at 08/26/16 1740  Gross per 24 hour  Intake             1460 ml  Output             2000 ml  Net             -540 ml   Filed Weights   08/25/16 2138 08/26/16 0605  Weight: 63 kg (139 lb) 63.1 kg (139 lb 1.6 oz)    Physical Exam    GEN: Well nourished, well developed, in no acute distress.  HEENT: Grossly normal.  Neck: Supple, no JVD, carotid bruits, or masses. Cardiac: RRR, no murmurs, rubs, or gallops. No clubbing, cyanosis, edema.  Radials/DP/PT 2+ and equal bilaterally.  Respiratory:  Respirations regular and unlabored, clear to auscultation bilaterally. GI: Soft, nontender, nondistended, BS + x 4. MS: no deformity or atrophy. Skin: warm and dry, no rash. Neuro:  Strength and sensation are intact. Psych: AAOx3.  Normal affect.  Labs    CBC  Recent Labs  08/26/16 0605  08/27/16 0530  WBC 11.0* 8.7  NEUTROABS 7.8*  --   HGB 15.3 14.8  HCT 45.4 43.1  MCV 90.4 90.0  PLT 240 240   Basic Metabolic Panel  Recent Labs  08/26/16 0605 08/27/16 0530  NA 137 137  K 4.1 3.6  CL 102 104  CO2 28 26  GLUCOSE 95 79  BUN 10 11  CREATININE 1.19 1.10  CALCIUM 9.4 9.2   Liver Function Tests  Recent Labs  08/26/16 0605  AST 27  ALT 25  ALKPHOS 72  BILITOT 0.6  PROT 7.0  ALBUMIN 4.1   No results for input(s): LIPASE, AMYLASE in the last 72 hours. Cardiac Enzymes  Recent Labs  08/26/16 0605 08/26/16 1051 08/26/16 1702  CKTOTAL 137  --   --   CKMB 1.2  --   --   TROPONINI <0.03 <0.03 <0.03   BNP Invalid input(s): POCBNP D-Dimer  Recent Labs  08/26/16 0139  DDIMER 0.29   Hemoglobin A1C No results for input(s): HGBA1C in the last 72 hours. Fasting Lipid Panel No results for input(s): CHOL, HDL, LDLCALC, TRIG, CHOLHDL, LDLDIRECT in the last 72 hours. Thyroid Function Tests  Recent Labs  08/26/16 0605  TSH 2.422    Telemetry    No events - Personally Reviewed  ECG    08/27/16: Sinus rhythm.  Rate 60 bpm. - Personally Reviewed  Radiology    Dg Chest 2 View  Result Date: 08/25/2016 CLINICAL DATA:  Acute onset of shortness of breath. Initial encounter. EXAM: CHEST  2 VIEW COMPARISON:  Chest radiograph performed 08/22/2015 FINDINGS: The lungs are well-aerated and clear. There is no evidence of focal opacification, pleural effusion or pneumothorax. The heart is normal in size; the mediastinal contour is within normal limits. No acute osseous abnormalities are seen. IMPRESSION: No acute cardiopulmonary process seen. Electronically Signed   By: Roanna RaiderJeffery  Chang M.D.   On: 08/25/2016 22:17    Cardiac Studies   Echo 08/27/16: Study Conclusions  - Left ventricle: The cavity size was normal. Wall thickness was   normal. Systolic function was normal. The estimated ejection   fraction was in the range of 60% to 65%. Left ventricular    diastolic function parameters were normal.  Patient Profile     8M with no significant past medical history who presented with chest pain and shortness of breath after a recent admission for acute viral myocarditis.    Assessment & Plan    # Myocarditis: # Shortness of breath:  Mr. Calton DachClements notes that his shortness of breath has improved.  Echo yesterday was unremarkable.  D-dimer was negative and CXR was unremarkable.  He was started empirically on colchicine for possible pericarditis.  Would plan for one month of colchicine 0.6mg  bid.  We will switch his ibuprofen to indomethacin 50mg  bid x1 month.  Continue protonix.  Carvedilol was stopped, as bradycardia may have been contributing to his shortness of breath.  Would give a short supply of hydrocodone for breath through pain.  OK to be off work 10 days.  Follow up scheduled 09/06/16.   OK for discharge from cardiology standpoint.  Signed, Chilton Siiffany [place], MD  08/27/2016, 10:12 AM

## 2016-08-27 NOTE — Discharge Summary (Signed)
Joshua Christensen, is a 26 y.o. male  DOB Jul 03, 1991  MRN [id number].  Admission date:  08/25/2016  Admitting Physician  Eduard Clos, MD  Discharge Date:  08/27/2016   Primary MD  No PCP Per Patient  Recommendations for primary care physician for things to follow:  - Check CBC, BMP during next visit   Admission Diagnosis  Hypoxia [R09.02]   Discharge Diagnosis  Hypoxia [R09.02]   Principal Problem:   Acute respiratory failure with hypoxia (HCC) Active Problems:   Myocarditis [facility])      Past Medical History:  Diagnosis Date  . Chest pain   . Gastroenteritis     Past Surgical History:  Procedure Laterality Date  . NO PAST SURGERIES    . WISDOM TOOTH EXTRACTION         History of present illness and  Hospital Course:     Kindly see H&P for history of present illness and admission details, please review complete Labs, Consult reports and Test reports for all details in brief  HPI  from the history and physical done on the day of admission 08/26/2016 HPI: Joshua Christensen is a 26 y.o. male with with recent admission for viral myocarditis discharged 3 days ago presents to the ER because of shortness of breath. Patient states since discharge his shortness of breath has been increasing. Denies any chest pain. Shortness of breath is present even at rest increases on exertion. Denies any productive cough fever or chills. Chest x-ray and EKG in the ER were unremarkable. D-dimer was negative. ER physician had discussed with on-call cardiologist who recommended admission for observation and repeating 2-D echo.   ED Course: Chest x-ray and EKG were unremarkable D level was negative troponin was negative.   Hospital Course  25 y.o.malewith with recent admission for viral myocarditis discharged3 days ago presents to the ER because of shortness of breath  Dyspnea - Patient presents with  dyspnea, recent history of acute viral myocarditis, no evidence of volume overload, respiratory distress, no wheezing, has been ambulated on the hallway multiple times with no hypoxia or tachycardia, significant anxiety, asking for oxygen even at rest. - Cardiology input greatly appreciated,  2-D echo 1/8 was unremarkable, empirically on colchicine for possible pericarditis, to take for 1 month, ibuprofen and changed to indomethacin 50 mg twice a day for 1 month per cardiology recommendation, to continue with Protonix, Coreg has been stopped for bradycardia.  Acute myocarditis - Recently discharged, mild case on cardiac MRI, normal EF, repeat 2-D echo unremarkable, on colchicine, indomethacin and Protonix, beta blocker stopped for bradycardia   Discharge Condition:  Stable   Follow UP  Follow-up Information    Cline Crock, PA-C Follow up.   Specialties:  Cardiology, Radiology Why:  on 1/19 at 8 am Contact information: [address] [place] [address] [phone]             Discharge Instructions  and  Discharge Medications    Discharge Instructions    Discharge instructions  Complete by:  As directed    Follow with Primary MD  in 7 days   Get CBC, CMP,   by Primary MD next visit.    Activity: As tolerated with Full fall precautions use walker/cane & assistance as needed   Disposition Home    Diet: Regular diet   On your next visit with your primary care physician please Get Medicines reviewed and adjusted.   Please request your Prim.MD to go over all Hospital Tests and Procedure/Radiological results at the follow up, please get all Hospital records sent to your Prim MD by signing hospital release before you go home.   If you experience worsening of your admission symptoms, develop shortness of breath, life threatening emergency, suicidal or homicidal thoughts you must seek medical attention immediately by calling 911 or calling your MD  immediately  if symptoms less severe.  You Must read complete instructions/literature along with all the possible adverse reactions/side effects for all the Medicines you take and that have been prescribed to you. Take any new Medicines after you have completely understood and accpet all the possible adverse reactions/side effects.   Do not drive, operating heavy machinery, perform activities at heights, swimming or participation in water activities or provide baby sitting services if your were admitted for syncope or siezures until you have seen by Primary MD or a Neurologist and advised to do so again.  Do not drive when taking Pain medications.    Do not take more than prescribed Pain, Sleep and Anxiety Medications  Special Instructions: If you have smoked or chewed Tobacco  in the last 2 yrs please stop smoking, stop any regular Alcohol  and or any Recreational drug use.  Wear Seat belts while driving.   Please note  You were cared for by a hospitalist during your hospital stay. If you have any questions about your discharge medications or the care you received while you were in the hospital after you are discharged, you can call the unit and asked to speak with the hospitalist on call if the hospitalist that took care of you is not available. Once you are discharged, your primary care physician will handle any further medical issues. Please note that NO REFILLS for any discharge medications will be authorized once you are discharged, as it is imperative that you return to your primary care physician (or establish a relationship with a primary care physician if you do not have one) for your aftercare needs so that they can reassess your need for medications and monitor your lab values.   Increase activity slowly    Complete by:  As directed      Allergies as of 08/27/2016   No Known Allergies     Medication List    STOP taking these medications   carvedilol 3.125 MG tablet Commonly  known as:  COREG   ibuprofen 600 MG tablet Commonly known as:  ADVIL,MOTRIN     TAKE these medications   colchicine 0.6 MG tablet Take 1 tablet (0.6 mg total) by mouth 2 (two) times daily.   HYDROcodone-acetaminophen 5-325 MG tablet Commonly known as:  NORCO/VICODIN Take 1 tablet by mouth every 6 (six) hours as needed for moderate pain.   indomethacin 50 MG capsule Commonly known as:  INDOCIN Take 1 capsule (50 mg total) by mouth 2 (two) times daily with a meal.   pantoprazole 40 MG tablet Commonly known as:  PROTONIX Take 1 tablet (40 mg total) by mouth daily. What changed:  additional instructions         Diet and Activity recommendation: See Discharge Instructions above   Consults obtained -  Cardiology   Major procedures and Radiology Reports - PLEASE review detailed and final reports for all details, in brief -      Dg Chest 2 View  Result Date: 08/25/2016 CLINICAL DATA:  Acute onset of shortness of breath. Initial encounter. EXAM: CHEST  2 VIEW COMPARISON:  Chest radiograph performed 08/22/2015 FINDINGS: The lungs are well-aerated and clear. There is no evidence of focal opacification, pleural effusion or pneumothorax. The heart is normal in size; the mediastinal contour is within normal limits. No acute osseous abnormalities are seen. IMPRESSION: No acute cardiopulmonary process seen. Electronically Signed   By: Roanna RaiderJeffery  Chang M.D.   On: 08/25/2016 22:17   Dg Chest 2 View  Result Date: 08/21/2016 CLINICAL DATA:  Dyspnea with upper respiratory symptoms starting 1 week ago. EXAM: CHEST  2 VIEW COMPARISON:  03/01/2012 FINDINGS: The heart size and mediastinal contours are within normal limits. Both lungs are clear. The visualized skeletal structures are unremarkable. IMPRESSION: No active cardiopulmonary disease. Electronically Signed   By: Tollie Ethavid  Kwon M.D.   On: 08/21/2016 20:45   Mr Cardiac Morphology W Wo Contrast  Result Date: 08/22/2016 CLINICAL DATA:   26 year old with atypical chest pain, elevated troponin and positive mononucleosis screen. EXAM: CARDIAC MRI TECHNIQUE: The patient was scanned on a 1.5 Tesla GE magnet. A dedicated cardiac coil was used. Functional imaging was done using Fiesta sequences. 2,3, and 4 chamber views were done to assess for RWMA's. Modified Simpson's rule using a short axis stack was used to calculate an ejection fraction on a dedicated work Research officer, trade unionstation using Circle software. The patient received 24 cc of Multihance. After 10 minutes inversion recovery sequences were used to assess for infiltration and scar tissue. CONTRAST:  24 cc  of Multihance FINDINGS: 1. Normal left ventricular size, thickness and systolic function (LVEF = 58%) with no regional wall motion abnormalities. There is mild diffuse mid epicardial late gadolinium enhancement in the mid anterior, anteroseptal, apical septal and anterior walls. LVEDD:  54 mm LVESD:  38 mm LVEDV:  147 ml LVESV; 62 ml SV:  86 ml CO:  5.0 L/min Myocardial mass:  97 g 2. Normal right ventricular size, thickness and systolic function (LVEF = 55%) with no regional wall motion abnormalities. RVEDV:  155 ml RVESV:  70 ml SV:  85 ml CO:  4.9 L/min 3.  Normal biatrial size. 4.  Mild tricuspid regurgitation. 5. Normal size of the aortic root and thoracic aorta. 6.  Normal pericardium, no pericardial effusion. IMPRESSION: 1. Normal left ventricular size, thickness and systolic function (LVEF = 58%) with no regional wall motion abnormalities. There is mild diffuse mid epicardial late gadolinium enhancement in the mid anterior, anteroseptal, apical septal and anterior walls. 2. Normal right ventricular size, thickness and systolic function (LVEF = 55%) with no regional wall motion abnormalities. 3.  Normal biatrial size. 4.  Mild tricuspid regurgitation. 5. Normal size of the aortic root and thoracic aorta. 6.  Normal pericardium, no pericardial effusion. Collectively, these findings are consistent with an  acute myocarditis with involvement of 4 segments and preserved biventricular function. No evidence for pericarditis. Tobias AlexanderKatarina Nelson Electronically Signed   By: Tobias AlexanderKatarina  Nelson   On: 08/22/2016 15:29    Micro Results    Recent Results (from the past 240 hour(s))  Rapid strep screen (not at [facility])     Status: None  Collection Time: 08/22/16  6:19 AM  Result Value Ref Range Status   Streptococcus, Group A Screen (Direct) NEGATIVE NEGATIVE Final    Comment: (NOTE) A Rapid Antigen test may result negative if the antigen level in the sample is below the detection level of this test. The FDA has not cleared this test as a stand-alone test therefore the rapid antigen negative result has reflexed to a Group A Strep culture.   Culture, group A strep     Status: None   Collection Time: 08/22/16  6:19 AM  Result Value Ref Range Status   Specimen Description THROAT  Final   Special Requests NONE Reflexed from [id number]  Final   Culture NO GROUP A STREP (S.PYOGENES) ISOLATED  Final   Report Status 08/24/2016 FINAL  Final    Today   Subjective:   Joshua Christensen today has no headache,no chest or abdominal pain, unrelated in the hallways with no hypoxia    Objective:   Blood pressure 113/65, pulse 61, temperature 97.4 F (36.3 C), temperature source Oral, resp. rate 18, height 5\' 5"  (1.651 m), weight 63.1 kg (139 lb 1.6 oz), SpO2 100 %.   Intake/Output Summary (Last 24 hours) at 08/27/16 1505 Last data filed at 08/27/16 0900  Gross per 24 hour  Intake              360 ml  Output             1000 ml  Net             -640 ml    Exam Awake Alert, Oriented x 3, No new F.N deficits, Normal affect [place].AT,PERRAL Supple Neck,No JVD, No cervical lymphadenopathy appriciated.  Symmetrical Chest wall movement, Good air movement bilaterally, CTAB RRR,No Gallops,Rubs or new Murmurs, No Parasternal Heave +ve B.Sounds, Abd Soft, Non tender, No rebound -guarding or rigidity. No Cyanosis, Clubbing  or edema, No new Rash or bruise  Data Review   CBC w Diff:  Lab Results  Component Value Date   WBC 8.7 08/27/2016   HGB 14.8 08/27/2016   HCT 43.1 08/27/2016   PLT 240 08/27/2016   LYMPHOPCT 20 08/26/2016   MONOPCT 7 08/26/2016   EOSPCT 2 08/26/2016   BASOPCT 0 08/26/2016    CMP:  Lab Results  Component Value Date   NA 137 08/27/2016   K 3.6 08/27/2016   CL 104 08/27/2016   CO2 26 08/27/2016   BUN 11 08/27/2016   CREATININE 1.10 08/27/2016   PROT 7.0 08/26/2016   ALBUMIN 4.1 08/26/2016   BILITOT 0.6 08/26/2016   ALKPHOS 72 08/26/2016   AST 27 08/26/2016   ALT 25 08/26/2016  .   Total Time in preparing paper work, data evaluation and todays exam - 35 minutes  ELGERGAWY, DAWOOD M.D on 08/27/2016 at 3:05 PM  [facility]   Office  [phone]

## 2016-08-27 NOTE — Progress Notes (Signed)
During ambulation, the patients O2 saturation dropped to 92% on Ra, but returned to 100% after rest. MD notified.

## 2016-08-28 NOTE — Patient Instructions (Addendum)
Your physician has recommended you make the following change in your medication:  STOP  COLCHICINE START  METOPROLOL 12.5 MG  EVERY DAY    Non-Cardiac CT scanning, (CAT scanning), is a noninvasive, special x-ray that produces cross-sectional images of the body using x-rays and a computer. CT scans help physicians diagnose and treat medical conditions. For some CT exams, a contrast material is used to enhance visibility in the area of the body being studied. CT scans provide greater clarity and reveal more details than regular x-ray exams.  TODAY  Your physician recommends that you return for lab work in:  TODAY STAT  CK MB , SED RATE,  AND  TROPONIN  Your physician recommends that you schedule a follow-up appointment in:  BEGINNING  OF NEXT WITH   KATIE IF  POSSIBLE

## 2016-09-03 NOTE — Progress Notes (Deleted)
Cardiology Office Note    Date:  09/03/2016   ID:  Joshua Christensen, DOB August 24, 1990, MRN [id number]  PCP:  No PCP Per Patient  Cardiologist:  Dr. Anne Fu  CC: post hospital follow up  History of Present Illness:  Joshua Christensen is a 26 y.o. male with a history of viral myocarditis who presents to clinic for follow up.  He presented to to [facility] on 08/21/16 with chest pain. Diagnosed with viral myocarditis felt 2/2 to mononucleosis. Cardiac MR noted mild myocarditis and normal LV function. Started on Coreg 3.125mg  BID and ibuprofen 600mg  TID  x 2 weeks. Discharged on 08/23/16.  He returned to the hospital on 08/26/16 with chest pain/SOB. Troponin normal. D-dimer negative. Repeat 2D ECHO was normal. Hydrocodone was added and coreg discontinued due to concern it may be contributing to SOB.   Today he presents to clinic for follow up.    Past Medical History:  Diagnosis Date  . Chest pain   . Gastroenteritis     Past Surgical History:  Procedure Laterality Date  . NO PAST SURGERIES    . WISDOM TOOTH EXTRACTION      Current Medications: Outpatient Medications Prior to Visit  Medication Sig Dispense Refill  . colchicine 0.6 MG tablet Take 1 tablet (0.6 mg total) by mouth 2 (two) times daily. 60 tablet 0  . HYDROcodone-acetaminophen (NORCO/VICODIN) 5-325 MG tablet Take 1 tablet by mouth every 6 (six) hours as needed for moderate pain. 10 tablet 0  . indomethacin (INDOCIN) 50 MG capsule Take 1 capsule (50 mg total) by mouth 2 (two) times daily with a meal. 60 capsule 0  . pantoprazole (PROTONIX) 40 MG tablet Take 1 tablet (40 mg total) by mouth daily. 30 tablet 0   No facility-administered medications prior to visit.      Allergies:   Patient has no known allergies.   Social History   Social History  . Marital status: Single    Spouse name: N/A  . Number of children: N/A  . Years of education: N/A   Social History Main Topics  . Smoking status: Never Smoker  . Smokeless  tobacco: Never Used  . Alcohol use No  . Drug use: No  . Sexual activity: Not on file   Other Topics Concern  . Not on file   Social History Narrative  . No narrative on file     Family History:  The patient's ***family history includes CAD in his father and maternal grandfather; CVA in his maternal grandmother; Healthy in his mother.      *** ROS/PE    Wt Readings from Last 3 Encounters:  08/26/16 139 lb 1.6 oz (63.1 kg)  08/23/16 137 lb 12.8 oz (62.5 kg)  07/26/16 143 lb (64.9 kg)      Studies/Labs Reviewed:   EKG:  EKG is*** ordered today.  The ekg ordered today demonstrates ***  Recent Labs: 08/26/2016: ALT 25; B Natriuretic Peptide 18.3; TSH 2.422 08/27/2016: BUN 11; Creatinine, Ser 1.10; Hemoglobin 14.8; Platelets 240; Potassium 3.6; Sodium 137   Lipid Panel No results found for: CHOL, TRIG, HDL, CHOLHDL, VLDL, LDLCALC, LDLDIRECT  Additional studies/ records that were reviewed today include:  Cardiac MR 08/23/15 IMPRESSION: 1. Normal left ventricular size, thickness and systolic function (LVEF = 58%) with no regional wall motion abnormalities. There is mild diffuse mid epicardial late gadolinium enhancement in the mid anterior, anteroseptal, apical septal and anterior walls. 2. Normal right ventricular size, thickness and systolic function (LVEF = 55%)  with no regional wall motion abnormalities. 3. Normal biatrial size. 4. Mild tricuspid regurgitation. 5. Normal size of the aortic root and thoracic aorta. 6. Normal pericardium, no pericardial effusion. 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 No evidence for pericarditis.   2D ECHO: 08/22/2016 LV EF: 55% - 60% Study Conclusions - Left ventricle: The cavity size was normal. Systolic function was normal. The estimated ejection fraction was in the range of 55% to 60%. Wall motion was normal; there were  no regional wall motion abnormalities. Left ventricular diastolic function parameters were normal. - Aortic valve: Trileaflet; normal thickness leaflets. There was no regurgitation. - Aortic root: The aortic root was normal in size. - Mitral valve: There was no regurgitation. - Left atrium: The atrium was normal in size. - Right ventricle: The cavity size was normal. Wall thickness was normal. Systolic function was normal. - Right atrium: The atrium was normal in size. - Tricuspid valve: There was trivial regurgitation. - Pulmonic valve: There was no regurgitation. - Pulmonary arteries: Systolic pressure was within the normal range. - Inferior vena cava: The vessel was normal in size. - Pericardium, extracardiac: There was no pericardial effusion. Impressions: - Normal study.  2D ECHO: 08/26/2016 LV EF: 60% -   65% Study Conclusions - Left ventricle: The cavity size was normal. Wall thickness was   normal. Systolic function was normal. The estimated ejection   fraction was in the range of 60% to 65%. Left ventricular   diastolic function parameters were normal.   ASSESSMENT & PLAN:   Viral myocarditis:   Medication Adjustments/Labs and Tests Ordered: Current medicines are reviewed at length with the patient today.  Concerns regarding medicines are outlined above.  Medication changes, Labs and Tests ordered today are listed in the Patient Instructions below. Patient Instructions     Signed, Cline CrockKathryn Kimiya Brunelle, PA-C  09/03/2016 9:02 AM    [facility] [address], [place], [place]  [address] Phone: [phone]; Fax: [phone]

## 2016-09-06 ENCOUNTER — Telehealth: Payer: Self-pay | Admitting: Cardiology

## 2016-09-06 ENCOUNTER — Encounter: Payer: Self-pay | Admitting: Cardiology

## 2016-09-06 ENCOUNTER — Encounter: Payer: Self-pay | Admitting: *Deleted

## 2016-09-06 ENCOUNTER — Ambulatory Visit (INDEPENDENT_AMBULATORY_CARE_PROVIDER_SITE_OTHER)
Admission: RE | Admit: 2016-09-06 | Discharge: 2016-09-06 | Disposition: A | Discharge status: Home / Self Care | Payer: BLUE CROSS/BLUE SHIELD | Source: Ambulatory Visit | Attending: Cardiology | Admitting: Cardiology

## 2016-09-06 ENCOUNTER — Ambulatory Visit (INDEPENDENT_AMBULATORY_CARE_PROVIDER_SITE_OTHER): Payer: BLUE CROSS/BLUE SHIELD | Admitting: Cardiology

## 2016-09-06 ENCOUNTER — Encounter: Payer: BLUE CROSS/BLUE SHIELD | Admitting: Physician Assistant

## 2016-09-06 VITALS — BP 110/82 | HR 108 | Ht 65.0 in | Wt 142.6 lb

## 2016-09-06 DIAGNOSIS — R0602 Shortness of breath: Secondary | ICD-10-CM | POA: Diagnosis not present

## 2016-09-06 DIAGNOSIS — R000 Tachycardia, unspecified: Secondary | ICD-10-CM

## 2016-09-06 DIAGNOSIS — I400 Infective myocarditis: Secondary | ICD-10-CM | POA: Diagnosis not present

## 2016-09-06 MED ORDER — METOPROLOL SUCCINATE ER 25 MG PO TB24: 12.5000 mg | ORAL_TABLET | Freq: Every day | ORAL | 3 refills | Status: DC

## 2016-09-06 MED ORDER — IOPAMIDOL (ISOVUE-370) INJECTION 76%
80.0000 mL | Freq: Once | INTRAVENOUS | Status: AC | PRN
  Administered 2016-09-06: 80 mL via INTRAVENOUS

## 2016-09-06 MED ORDER — METOPROLOL SUCCINATE ER 25 MG PO TB24
12.5000 mg | ORAL_TABLET | Freq: Every day | ORAL | 3 refills | Status: DC
Start: 2016-09-06 — End: 2016-09-06

## 2016-09-06 NOTE — Progress Notes (Signed)
Cardiology Office Note   Date:  09/06/2016   ID:  Joshua Christensen, DOB October 24, 1990, MRN [id number]  PCP:  No PCP Per Patient  Cardiologist:  Dr. Anne Fu    Chief Complaint  Patient presents with  . Hospitalization Follow-up    SOB      History of Present Illness: Joshua Christensen is a 26 y.o. male who presents for hospital follow up second admit due to viral myocarditis.     He presented to to [facility] on 08/21/16 with chest pain. Diagnosed with viral myocarditis felt 2/2 to mononucleosis. Cardiac MR noted mild myocarditis and normal LV function. Started on Coreg 3.125mg  BID and ibuprofen 600mg  TID x 2 weeks. Discharged on 08/23/16.  Plan for colchicine for 1 month.  Coreg was stopped due to bradycardia.  Off work 10 days.    He returned to the hospital on 08/26/16 with chest pain/SOB. Troponin normal. D-dimer negative. Repeat 2D ECHO was normal. Hydrocodone was added and coreg discontinued due to concern it may be contributing to SOB.    Today he presents to clinic for follow up. He still is very DOE.  Cannot walk down the hall with out DOE.  Some mild chest pain.  He has now been having diarrhea on the colchicine.  He is worried that he cannot yet work.  No fever or aching.    Cardiac MR 08/23/15  IMPRESSION:  1. Normal left ventricular size, thickness and systolic function  (LVEF = 58%) with no regional wall motion abnormalities.  There is mild diffuse mid epicardial late gadolinium enhancement in  the mid anterior, anteroseptal, apical septal and anterior walls.  2. Normal right ventricular size, thickness and systolic function  (LVEF = 55%) with no regional wall motion abnormalities.  3. Normal biatrial size.  4. Mild tricuspid regurgitation.  5. Normal size of the aortic root and thoracic aorta.  6. Normal pericardium, no pericardial effusion.  Collectively, these findings are consistent with an acute  myocarditis with involvement of 4 segments and preserved  biventricular  function. No evidence for pericarditis.      2D ECHO: 08/22/2016  LV EF: 55% - 60%  Study Conclusions  - Left ventricle: The cavity size was normal. Systolic function was  normal. The estimated ejection fraction was in the range of 55%  to 60%. Wall motion was normal; there were no regional wall  motion abnormalities. Left ventricular diastolic function  parameters were normal.  - Aortic valve: Trileaflet; normal thickness leaflets. There was no  regurgitation.  - Aortic root: The aortic root was normal in size.  - Mitral valve: There was no regurgitation.  - Left atrium: The atrium was normal in size.  - Right ventricle: The cavity size was normal. Wall thickness was  normal. Systolic function was normal.  - Right atrium: The atrium was normal in size.  - Tricuspid valve: There was trivial regurgitation.  - Pulmonic valve: There was no regurgitation.  - Pulmonary arteries: Systolic pressure was within the normal  range.  - Inferior vena cava: The vessel was normal in size.  - Pericardium, extracardiac: There was no pericardial effusion.  Impressions:  - Normal study.    2D ECHO: 08/26/2016  LV EF: 60% - 65%  Study Conclusions  - Left ventricle: The cavity size was normal. Wall thickness was  normal. Systolic function was normal. The estimated ejection  fraction was in the range of 60% to 65%. Left ventricular  diastolic function parameters were normal.  Past Medical History:  Diagnosis Date  . Chest pain   . Gastroenteritis     Past Surgical History:  Procedure Laterality Date  . NO PAST SURGERIES    . WISDOM TOOTH EXTRACTION       Current Outpatient Prescriptions  Medication Sig Dispense Refill  . colchicine 0.6 MG tablet Take 1 tablet (0.6 mg total) by mouth 2 (two) times daily. 60 tablet 0  . HYDROcodone-acetaminophen (NORCO/VICODIN) 5-325 MG tablet Take 1 tablet by mouth every 6 (six) hours as needed for moderate pain. 10 tablet  0  . indomethacin (INDOCIN) 50 MG capsule Take 1 capsule (50 mg total) by mouth 2 (two) times daily with a meal. 60 capsule 0  . pantoprazole (PROTONIX) 40 MG tablet Take 1 tablet (40 mg total) by mouth daily. 30 tablet 0   No current facility-administered medications for this visit.     Allergies:   Patient has no known allergies.    Social History:  The patient  reports that he has never smoked. He has never used smokeless tobacco. He reports that he does not drink alcohol or use drugs.   Family History:  The patient's family history includes CAD in his father and maternal grandfather; CVA in his maternal grandmother; Healthy in his mother.    ROS:  General:no colds or fevers, no weight changes Skin:no rashes or ulcers HEENT:no blurred vision, no congestion CV:see HPI PUL:see HPI GI:no diarrhea constipation or melena, no indigestion GU:no hematuria, no dysuria MS:no joint pain, no claudication Neuro:no syncope, no lightheadedness Endo:no diabetes, no thyroid disease  Wt Readings from Last 3 Encounters:  09/06/16 142 lb 9.6 oz (64.7 kg)  08/26/16 139 lb 1.6 oz (63.1 kg)  08/23/16 137 lb 12.8 oz (62.5 kg)     PHYSICAL EXAM: VS:  BP 110/82   Pulse (!) 108   Ht 5\' 5"  (1.651 m)   Wt 142 lb 9.6 oz (64.7 kg)   BMI 23.73 kg/m  , BMI Body mass index is 23.73 kg/m. General:Pleasant affect, NAD Skin:Warm and dry, brisk capillary refill HEENT:normocephalic, sclera clear, mucus membranes moist Neck:supple, no JVD, no bruits  Heart:S1S2 RRR without murmur, gallup, rub or click Lungs:clear without rales, rhonchi, or wheezes ZOX:WRUE, non tender, + BS, do not palpate liver spleen or masses Ext:no lower ext edema, 2+ pedal pulses, 2+ radial pulses Neuro:alert and oriented, MAE, follows commands, + facial symmetry  Ortho BPs  Lying   122.84 p 102 Sitting  130/78 P 105  Standing  111/77, P 132 + lightheadedness and dizzy  Standing 3 min   125/90 HR 137 and + chest tightness. And  dizzy.  EKG:  EKG is ordered today. The ekg ordered today demonstrates ST at 108 no acute changes from the hospital except faster rate.    Recent Labs: 08/26/2016: ALT 25; B Natriuretic Peptide 18.3; TSH 2.422 08/27/2016: BUN 11; Creatinine, Ser 1.10; Hemoglobin 14.8; Platelets 240; Potassium 3.6; Sodium 137    Lipid Panel No results found for: CHOL, TRIG, HDL, CHOLHDL, VLDL, LDLCALC, LDLDIRECT     Other studies Reviewed: Additional studies/ records that were reviewed today include: see above..   ASSESSMENT AND PLAN:  1.  DOE with increased HR.  Now with diarrhea, he states he is drinking a lot of water but he is mildly orthostatic.  Discussed with Dr. Mayford Knife, will stop colchicine, check sed rate, check CTA to rule out PE.  Encouraged to drink gatorade and water.   I will follow up with him  Monday to see if improved.  Added torpol XL 12.5 mg daily..   2. Myocarditis. Failed ibuprofen now on indocin and colchicine  With diarrhea, stop colchicine   3. DOE with tachycardia, his spo2 was 96% with ambulation HR to 126. Afebrile.    Current medicines are reviewed with the patient today.  The patient Has no concerns regarding medicines.  The following changes have been made:  See above Labs/ tests ordered today include:see above  Disposition:   FU:  see above  Signed, Nada BoozerLaura Ingold, NP  09/06/2016 10:57 AM    [facility] [address], [place], [place]  [address]/ [address] [place], [place] Phone: [phone]; Fax: [phone]  [phone]

## 2016-09-06 NOTE — Telephone Encounter (Signed)
New message       Pt was seen today.  He forgot to get a note for work.  He said Vernona RiegerLaura was keeping him out of work for 7 days starting today.  Please mail note to his home address.  If any problems, please call

## 2016-09-06 NOTE — Telephone Encounter (Signed)
WORK  EXCUSE LETTER  MAILED TO PT  AT  PT'S REQUEST .Zack Seal/CY

## 2016-09-07 LAB — CREATININE KINASE MB: CK-MB Index: 1.2 ng/mL (ref 0.0–10.4)

## 2016-09-07 LAB — SEDIMENTATION RATE: Sed Rate: 2 mm/hr (ref 0–15)

## 2016-09-07 LAB — TROPONIN I: Troponin I: <0.01 ng/mL (ref 0.00–0.04)

## 2016-09-09 ENCOUNTER — Encounter: Payer: Self-pay | Admitting: Cardiology

## 2016-09-09 ENCOUNTER — Ambulatory Visit (INDEPENDENT_AMBULATORY_CARE_PROVIDER_SITE_OTHER): Payer: BLUE CROSS/BLUE SHIELD | Admitting: Cardiology

## 2016-09-09 VITALS — BP 122/77 | HR 100 | Ht 65.0 in | Wt 141.0 lb

## 2016-09-09 DIAGNOSIS — R0602 Shortness of breath: Secondary | ICD-10-CM | POA: Diagnosis not present

## 2016-09-09 DIAGNOSIS — R001 Bradycardia, unspecified: Secondary | ICD-10-CM

## 2016-09-09 DIAGNOSIS — I495 Sick sinus syndrome: Secondary | ICD-10-CM

## 2016-09-09 DIAGNOSIS — I400 Infective myocarditis: Secondary | ICD-10-CM

## 2016-09-09 NOTE — Progress Notes (Signed)
Cardiology Office Note   Date:  09/09/2016   ID:  Joshua Christensen, DOB [birth year], MRN [id number]  PCP:  No PCP Per Patient  Cardiologist:  Dr. Anne FuSkains    Chief Complaint  Patient presents with  . Shortness of Breath    frequently  . Dizziness    frequently;constantly.  . Chest Pain    chest tightness      History of Present Illness: Joshua Christensen is a 26 y.o. male who presents for SOB follow up from Friday after resuming BB and tests with neg PE.  Joshua Christensen is a 26 y.o. male who presents for hospital follow up second admit due to viral myocarditis.     [facility] with chest pain. Diagnosed with viral myocarditis felt 2/2 to mononucleosis. Cardiac MR noted mild myocarditis and normal LV function. Started on Coreg 3.125mg  BID and ibuprofen 600mg  TID x 2 weeks. Discharged on 08/23/16.  Plan for colchicine for 1 month.  Coreg was stopped due to bradycardia.  Off work 10 days.    He returned to the hospital on 08/26/16 with chest pain/SOB. Troponin normal. D-dimer negative. Repeat 2D ECHO was normal. Hydrocodone was added and coreg discontinued due to concern it may be contributing to SOB.    Today he presents to clinic for follow up after labs and med changes and CTA to rule out PE.  No PE, and HR improved on low dose BB.  He is no longer orthostatic now off colchicine and no further diarrhea.  He still is very DOE.  Cannot walk down the hall with out DOE.  Some mild chest pressure.   he is worried about driving to [place] with dizziness.  Discussed increasing salt before he leaves house with bullion.  Stopping at exists half way there to walk around and eat if needed.    Past Medical History:  Diagnosis Date  . Chest pain   . Gastroenteritis     Past Surgical History:  Procedure Laterality Date  . NO PAST SURGERIES    . WISDOM TOOTH EXTRACTION       Current Outpatient Prescriptions  Medication Sig Dispense Refill  .  HYDROcodone-acetaminophen (NORCO/VICODIN) 5-325 MG tablet Take 1 tablet by mouth every 6 (six) hours as needed for moderate pain. 10 tablet 0  . indomethacin (INDOCIN) 50 MG capsule Take 1 capsule (50 mg total) by mouth 2 (two) times daily with a meal. 60 capsule 0  . metoprolol succinate (TOPROL-XL) 25 MG 24 hr tablet Take 0.5 tablets (12.5 mg total) by mouth daily. 45 tablet 3  . pantoprazole (PROTONIX) 40 MG tablet Take 1 tablet (40 mg total) by mouth daily. 30 tablet 0   No current facility-administered medications for this visit.     Allergies:   Patient has no known allergies.    Social History:  The patient  reports that he has never smoked. He has never used smokeless tobacco. He reports that he does not drink alcohol or use drugs.   Family History:  The patient's family history includes CAD in his father and maternal grandfather; CVA in his maternal grandmother; Healthy in his mother.    ROS:  General:no colds or fevers, no weight changes Skin:no rashes or ulcers HEENT:no blurred vision, no congestion CV:see HPI PUL:see HPI GI:no diarrhea constipation or melena, no indigestion GU:no hematuria, no dysuria MS:no joint pain, no claudication Neuro:no syncope, no lightheadedness Endo:no diabetes, no thyroid disease  Wt Readings from Last 3 Encounters:  09/09/16 141 lb (64 kg)  09/06/16 142 lb 9.6 oz (64.7 kg)  08/26/16 139 lb 1.6 oz (63.1 kg)     PHYSICAL EXAM: VS:  BP 122/77   Pulse 100   Ht 5\' 5"  (1.651 m)   Wt 141 lb (64 kg)   BMI 23.46 kg/m  , BMI Body mass index is 23.46 kg/m. General:Pleasant affect, NAD Skin:Warm and dry, brisk capillary refill HEENT:normocephalic, sclera clear, mucus membranes moist Neck:supple, no JVD, no bruits  Heart:S1S2 RRR without murmur, gallup, rub or click Lungs:clear without rales, rhonchi, or wheezes ZOX:WRUE, non tender, + BS, do not palpate liver spleen or masses Ext:no lower ext edema, 2+ pedal pulses, 2+ radial  pulses Neuro:alert and oriented, MAE, follows commands, + facial symmetry    EKG:  EKG is not ordered today.    Recent Labs: 08/26/2016: ALT 25; B Natriuretic Peptide 18.3; TSH 2.422 08/27/2016: BUN 11; Creatinine, Ser 1.10; Hemoglobin 14.8; Platelets 240; Potassium 3.6; Sodium 137    Lipid Panel No results found for: CHOL, TRIG, HDL, CHOLHDL, VLDL, LDLCALC, LDLDIRECT     Other studies Reviewed: Additional studies/ records that were reviewed today include:  (LVEF = 58%) with no regional wall motion abnormalities.  There is mild diffuse mid epicardial late gadolinium enhancement in the mid anterior, anteroseptal, apical septal and anterior walls.  2. Normal right ventricular size, thickness and systolic function (LVEF = 55%) with no regional wall motion abnormalities.  3.  Normal biatrial size.  4.  Mild tricuspid regurgitation.  5. Normal size of the aortic root and thoracic aorta.  6.  Normal pericardium, no pericardial effusion.  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 No evidence for pericarditis.   cta OF CHEST  COMPARISON:  None.  FINDINGS: Cardiovascular: No pulmonary embolus is identified. Heart size is normal. No pericardial effusion. No aortic aneurysm or dissection.  Mediastinum/Nodes: No enlarged mediastinal, hilar, or axillary lymph nodes. Thyroid gland, trachea, and esophagus demonstrate no significant findings.  Lungs/Pleura: Lungs are clear. No pleural effusion or pneumothorax.  Upper Abdomen: No acute abnormality.  Musculoskeletal: Negative.  Review of the MIP images confirms the above findings.  IMPRESSION: Negative for pulmonary embolus.  Negative chest CT.   ASSESSMENT AND PLAN:  1.  Myocarditis.  Continues with dizziness, now not orthostatic.  He does have sinus congestion, should see PCP for possible infection.  He has  been taking pseudoephedrine and I have asked him to only use plain  claritin - he may use flonase or nasocort for congestion as well.   --Dr. Rennis Golden and I discussed changing to steroids from [facility] but Dr. Rennis Golden believes he should continue current corse for now.  Sed rate is neg.  Will leave toprol at current dose and stop pseudoephedrine.     - will have him see Dr. Anne Fu next week. Discussed with pt this may take months to improve completely.  2. Dizziness may be from sinus issues, should see PCP, but also may be tachy brady syndrome.  Will have him wear 48 hour holter.   3.  Orthostatic hypotension no longer orthostatic.   4.  Diarrhea has resolved off colchicine.      Current medicines are reviewed with the patient today.  The patient Has no concerns regarding medicines.  The following changes have been made:  See above Labs/ tests ordered today include:see above  Disposition:   FU:  see above  Signed, Nada Boozer,  NP  09/09/2016 8:33 AM    [facility] [address] [place], [place], [place]  [address]/ [address] [place], [place] Phone: [phone]; Fax: [phone]  [phone]

## 2016-09-09 NOTE — Patient Instructions (Signed)
Medication Instructions:  Start over-the-counter NASACORT OR FLONASE FOR SINUS CONGESTION   If you need a refill on your cardiac medications before your next appointment, please call your pharmacy.  Labwork: NONE  Testing/Procedures: 48 HOUR HOLTER MONITOR    Follow-Up: Your physician recommends that you schedule a follow-up appointment in: 1 WEEK WITH DR Anne FuSKAINS TO REVIEW MONITOR AND FOLLOW UP       Thank you for choosing [facility]!!    Marcelino DusterMichelle, LPN Nada BoozerLAURA INGOLD, NP

## 2016-09-10 ENCOUNTER — Telehealth: Payer: Self-pay | Admitting: Cardiology

## 2016-09-10 MED ORDER — METOPROLOL SUCCINATE ER 25 MG PO TB24: 12.5000 mg | ORAL_TABLET | Freq: Two times a day (BID) | ORAL | 3 refills | Status: DC

## 2016-09-10 NOTE — Telephone Encounter (Signed)
Mr. Joshua Christensen  Was seen on yesterday and he is calling because he states she still have some chest tightness and some shortness of breath . Please call

## 2016-09-10 NOTE — Telephone Encounter (Signed)
Called patient about his symptoms. Patient states he feels like his chest tightness is worse today, then yesterday at his office visit. Patient stated his SOB is about the same. Patient is concerned about his Metoprolol dose. Patient stated he has been taking Metoprolol succinate 12.5 mg by mouth daily. Consulted Dr. Katrinka BlazingSmith (DOD), he recommends patient to watch for any swelling, signs and symptoms of fluid overload. Dr. Katrinka BlazingSmith recommend increasing patient's metoprolol 12.5 mg to BID. Informed patient if he feels like it's difficult to walk or lay down with his SOB to go to ED. Encouraged patient to take his medications as prescribed. Patient was instructed to start Flonase at visit yesterday that is OTC. 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 Informed patient to let our office know if he feels dizzy with his increase dose of metoprolol. Patient is just concerned about getting back to work. Informed patient that he is on the right regiment, per Dr. Katrinka BlazingSmith, and it is going to take some time to feel better. Encouraged patient to follow-up with his PCP and to call our office with any other questions or concerns. Patient verbalized understanding.

## 2016-09-11 ENCOUNTER — Other Ambulatory Visit: Payer: Self-pay | Admitting: *Deleted

## 2016-09-11 ENCOUNTER — Ambulatory Visit (INDEPENDENT_AMBULATORY_CARE_PROVIDER_SITE_OTHER): Payer: BLUE CROSS/BLUE SHIELD

## 2016-09-11 DIAGNOSIS — I495 Sick sinus syndrome: Secondary | ICD-10-CM

## 2016-09-11 DIAGNOSIS — R42 Dizziness and giddiness: Secondary | ICD-10-CM

## 2016-09-11 NOTE — Telephone Encounter (Signed)
New Message    Returning your call , please call asap he has a meeting in 5 minutes , if this is serious leave a messsage

## 2016-09-11 NOTE — Telephone Encounter (Signed)
Left message for patient to call back  

## 2016-09-24 ENCOUNTER — Telehealth: Payer: Self-pay | Admitting: Cardiology

## 2016-09-24 NOTE — Telephone Encounter (Signed)
Pt aware of monitor results. 

## 2016-09-24 NOTE — Telephone Encounter (Signed)
New Message  ° ° ° °Returning your call about lab results  °

## 2016-09-24 NOTE — Telephone Encounter (Signed)
Left message on private voicemail for pt that I was calling back to follow up with how he is feeling and to c/b if he is having any further concerns or questions.

## 2016-09-26 ENCOUNTER — Encounter: Payer: Self-pay | Admitting: Cardiology

## 2016-09-26 ENCOUNTER — Ambulatory Visit (INDEPENDENT_AMBULATORY_CARE_PROVIDER_SITE_OTHER): Payer: BLUE CROSS/BLUE SHIELD | Admitting: Cardiology

## 2016-09-26 ENCOUNTER — Telehealth: Payer: Self-pay | Admitting: Physician Assistant

## 2016-09-26 VITALS — BP 118/74 | HR 94 | Ht 64.0 in | Wt 147.8 lb

## 2016-09-26 DIAGNOSIS — R0602 Shortness of breath: Secondary | ICD-10-CM

## 2016-09-26 DIAGNOSIS — I400 Infective myocarditis: Secondary | ICD-10-CM

## 2016-09-26 MED ORDER — METOPROLOL SUCCINATE ER 25 MG PO TB24: 12.5000 mg | ORAL_TABLET | Freq: Two times a day (BID) | ORAL | 6 refills | Status: DC | PRN

## 2016-09-26 NOTE — Progress Notes (Signed)
Cardiology Office Note    Date:  09/26/2016   ID:  Joshua CrockerRharcell Christensen, DOB 12/29/1990, MRN [id number]  PCP:  Devra DoppHOWELL, TAMIEKA, MD  Cardiologist:   Donato SchultzMark Skains, MD     History of Present Illness:  Joshua Christensen is a 26 y.o. male follow up holter monitor. Viral myocarditis admit 08/21/16. MRI normal EF, mild evidence of myocarditis. Secondary to mononucleosis.   No coreg - brady originally but Toprol used subsequently.  Ibuprofen and cochicine  ER with CP again 08/26/16 and SOB. ECHO again was normal. Coreg stopped  CTA no PE. Visit on 1/22 - SOB. Currently this is improving. This is not improving as quickly as to be expected. He does not appear short of breath at rest. No JVD. Lungs are clear. Heart rate originally elevated was normal upon auscultation.    Past Medical History:  Diagnosis Date  . Chest pain   . Gastroenteritis     Past Surgical History:  Procedure Laterality Date  . NO PAST SURGERIES    . WISDOM TOOTH EXTRACTION      Current Medications: Outpatient Medications Prior to Visit  Medication Sig Dispense Refill  . pantoprazole (PROTONIX) 40 MG tablet Take 1 tablet (40 mg total) by mouth daily. 30 tablet 0  . metoprolol succinate (TOPROL-XL) 25 MG 24 hr tablet Take 0.5 tablets (12.5 mg total) by mouth 2 (two) times daily. 90 tablet 3  . HYDROcodone-acetaminophen (NORCO/VICODIN) 5-325 MG tablet Take 1 tablet by mouth every 6 (six) hours as needed for moderate pain. 10 tablet 0  . indomethacin (INDOCIN) 50 MG capsule Take 1 capsule (50 mg total) by mouth 2 (two) times daily with a meal. 60 capsule 0   No facility-administered medications prior to visit.      Allergies:   Patient has no known allergies.   Social History   Social History  . Marital status: Single    Spouse name: N/A  . Number of children: N/A  . Years of education: N/A   Social History Main Topics  . Smoking status: Never Smoker  . Smokeless tobacco: Never Used  . Alcohol use No  .  Drug use: No  . Sexual activity: Not Asked   Other Topics Concern  . None   Social History Narrative  . None     Family History:  The patient's family history includes CAD in his father and maternal grandfather; CVA in his maternal grandmother; Healthy in his mother.   ROS:   Please see the history of present illness.    ROS All other systems reviewed and are negative.   PHYSICAL EXAM:   VS:  BP 118/74   Pulse 94   Ht 5\' 4"  (1.626 m)   Wt 147 lb 12.8 oz (67 kg)   BMI 25.37 kg/m    GEN: Well nourished, well developed, in no acute distress  HEENT: normal  Neck: no JVD, carotid bruits, or masses Cardiac: RRR; no murmurs, rubs, or gallops,no edema  Respiratory:  clear to auscultation bilaterally, normal work of breathing GI: soft, nontender, nondistended, + BS MS: no deformity or atrophy  Skin: warm and dry, no rash Neuro:  Alert and Oriented x 3, Strength and sensation are intact Psych: euthymic mood, full affect  Wt Readings from Last 3 Encounters:  09/26/16 147 lb 12.8 oz (67 kg)  09/09/16 141 lb (64 kg)  09/06/16 142 lb 9.6 oz (64.7 kg)      Studies/Labs Reviewed:   EKG:  EKG is not  ordered today.    Recent Labs: 08/26/2016: ALT 25; B Natriuretic Peptide 18.3; TSH 2.422 08/27/2016: BUN 11; Creatinine, Ser 1.10; Hemoglobin 14.8; Platelets 240; Potassium 3.6; Sodium 137   Lipid Panel No results found for: CHOL, TRIG, HDL, CHOLHDL, VLDL, LDLCALC, LDLDIRECT  Additional studies/ records that were reviewed today include:   MRI (LVEF = 58%) with no regional wall motion abnormalities.  There is mild diffuse mid epicardial late gadolinium enhancement in the mid anterior, anteroseptal, apical septal and anterior walls.  2. Normal right ventricular size, thickness and systolic function (LVEF = 55%) with no regional wall motion abnormalities.  3. Normal biatrial size.  4. Mild tricuspid regurgitation.  5. Normal size of the aortic root and thoracic  aorta.  6. Normal pericardium, no pericardial effusion.  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 No evidence for pericarditis.   CTA OF CHEST  COMPARISON: None.  FINDINGS: Cardiovascular: No pulmonary embolus is identified. Heart size is normal. No pericardial effusion. No aortic aneurysm or dissection.  Mediastinum/Nodes: No enlarged mediastinal, hilar, or axillary lymph nodes. Thyroid gland, trachea, and esophagus demonstrate no significant findings.  Lungs/Pleura: Lungs are clear. No pleural effusion or pneumothorax.  Upper Abdomen: No acute abnormality.  Musculoskeletal: Negative.  Review of the MIP images confirms the above findings.  IMPRESSION: Negative for pulmonary embolus. Negative chest CT.  48 hour holter  2.8 second pause at 11:25 PM-asymptomatic  Average heart rate 66 bpm, sinus rhythm  No atrial fibrillation, no adverse arrhythmias detected  Sinus arrhythmia noted.  Rare PVCs, 3.   ASSESSMENT:    1. Acute viral myocarditis   2. SOB (shortness of breath)      PLAN:  In order of problems listed above:  Viral myocarditis  - Sed rate neg  - normal EF  - Low dose Toprol-Okay to stop only on 12.5. May take on as-needed basis.  - Off colchicine - Diarrhea-Okay to stop  - NSAID  - 48 holter reassuring (3 PVC, pause likely vagal) of no clinical consequence.  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  - Reassuring cardiac status.  - Heart rate on repeat auscultation was in the mid 70s.   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    At this point, comfortable with him returning on an as-needed basis.   Medication Adjustments/Labs and Tests Ordered: Current medicines are reviewed at length with  the patient today.  Concerns regarding medicines are outlined above.  Medication changes, Labs and Tests ordered today are listed in the Patient Instructions below. Patient Instructions  Medication Instructions:  Please discontinue your Clonidine and Metoprolol. Continue all other medications as listed.  You have been referred to Pulmonary for the evaluation of shortness of breath.  Follow-Up: Follow up as needed with Dr Anne Fu.  Thank you for choosing [facility]!!        Signed, Donato Schultz, MD  09/26/2016 9:18 AM    [facility] [address] [place], [place], [place]  [address] Phone: [phone]; Fax: [phone]

## 2016-09-26 NOTE — Addendum Note (Signed)
Addended by: Sharin GraveFLEMING, PAMELA J on: 09/26/2016 09:21 AM   Modules accepted: Orders

## 2016-09-26 NOTE — Patient Instructions (Addendum)
Medication Instructions:  Please discontinue your Colchicine.  You may take your Metoprolol as needed for increased heart rate. Continue all other medications as listed.  You have been referred to Pulmonary for the evaluation of shortness of breath.  Follow-Up: Follow up as needed with Dr Anne FuSkains.  Thank you for choosing [facility]!!

## 2016-09-26 NOTE — Telephone Encounter (Signed)
\    Starting having sharp chest pain and left arm feels weird after office visit with Dr.Skains today. He took 600mg  of ibuprofen and it didn't really help. Chest pain is intermittent and not worse with a deep breath in. 7/10 pain. All tests have been reassuring (normal EF, CTA neg for PE). Discussed how its probably continued inflammation. Went over ER precautions. Will send this note to Dr. Anne FuSkains.   [name] PA-C  [facility]

## 2016-09-27 ENCOUNTER — Telehealth: Payer: Self-pay | Admitting: Cardiology

## 2016-09-27 NOTE — Telephone Encounter (Signed)
New message      Pt c/o of Chest Pain: STAT if CP now or developed within 24 hours  1. Are you having CP right now?  yes  2. Are you experiencing any other symptoms (ex. SOB, nausea, vomiting, sweating)?  sob 3. How long have you been experiencing CP? Since office visit on yesterday 4. Is your CP continuous or coming and going?  continuous 5. Have you taken Nitroglycerin??

## 2016-09-27 NOTE — Telephone Encounter (Signed)
OK with me Mark Skains, MD  

## 2016-09-27 NOTE — Telephone Encounter (Signed)
Patient is complaining of intermittent 8/10 chest pain and SOB since yesterday afternoon when he was seen in the office. The patient states that yesterday he had a weird feeling in his left arm, but does not have that today. The patient denies any other symptoms.The patient states that he took ibuprofen yesterday evening and that it did not help. I spoke with Dr. Anne FuSkains and his recommendation was for the patient to continue taking ibuprofen for the next few days, that all of his testing was reassuring. Patient notified of Dr. Anne FuSkains recommendations. Patient was educated on ER precautions. Patient verbalized understanding.

## 2016-09-27 NOTE — Telephone Encounter (Signed)
New Message  Pt voiced wanting to be released from care with MD-Skain's and accepted by MD-[place].  Please f/u

## 2016-09-30 ENCOUNTER — Telehealth: Payer: Self-pay | Admitting: Cardiology

## 2016-09-30 NOTE — Telephone Encounter (Signed)
New  Message   Pt calling regarding medication given from sinus infection. Per pt was given steroid, and wants to make sure it will not affect heart medication. Requesting call back from nurse.

## 2016-09-30 NOTE — Telephone Encounter (Signed)
Follow up   Pt verbalized that he is returning call for rn and that he has not heard anything back from previous request:    regarding medication given from sinus infection. Per pt was given steroid, and wants to make sure it will not affect heart medication. Requesting call back from nurse.

## 2016-09-30 NOTE — Telephone Encounter (Signed)
That is fine with me.

## 2016-09-30 NOTE — Telephone Encounter (Signed)
Follow up    Pt calling to follow up from previous message.

## 2016-09-30 NOTE — Telephone Encounter (Signed)
Spoke with pt who is concerned about the RX another MD gave him for a sinus infection.  He reports the MD had his medication list and knew which medication is is presently on.  Pt was started on Prednisone and Cephalexan.  He c/o feeling as iff his heart was racing and had alittle chest thightness.  Advised this is a typical side effect from prednisone however he can either take his prn Metoprolol to help with the increased HR or not take the prednisone.  Pt states understanding and the naked me for calling to discuss the medication side effects with him.

## 2016-10-09 ENCOUNTER — Telehealth: Payer: Self-pay | Admitting: Cardiology

## 2016-10-09 NOTE — Telephone Encounter (Signed)
Pt continues to have shortness of breath and general feeling of achiness similar to symptoms that started in January 2018, pt denies having a temperature.  Pt states symptoms seem to be worse in the last week, he has been on an antibiotic for a sinus infection and has 3 more days to complete course, does not know name of antibiotic, does not feel he has the flu or other flu-like symptoms. Pt requesting appointment to evaluate symptoms.   Pt advised he has been scheduled to see Tereso NewcomerScott Weaver, PA 10/11/16.

## 2016-10-09 NOTE — Telephone Encounter (Signed)
Pt c/o Shortness Of Breath: STAT if SOB developed within the last 24 hours or pt is noticeably SOB on the phone  1. Are you currently SOB (can you hear that pt is SOB on the phone)? yes  2. How long have you been experiencing SOB? 1wk  3. Are you SOB when sitting or when up moving around? both 4. Are you currently experiencing any other symptoms? Achy all over

## 2016-10-10 NOTE — Progress Notes (Signed)
Cardiology Office Note:    Date:  10/11/2016   ID:  Joshua Christensen, DOB 05-22-1991, MRN [id number]  PCP:  Helane Rima, MD  Cardiologist:  Dr. Candee Furbish  >> Dr. Skeet Latch  Electrophysiologist:  n/a  Referring MD: Helane Rima, MD   Chief Complaint  Patient presents with  . Shortness of Breath    History of Present Illness:    Joshua Christensen is a 26 y.o. male with a hx of Viral myocarditis in 1/18 in the setting of mononucleosis.  He was admitted 1/3-1/5 with chest pain and elevated Troponin levels.  EF was normal on Echo.  Cardiac MRI was c/w acute myocarditis. Mono screen was positive. He was placed on beta blocker and NSAIDs. He was readmitted several days later with shortness of breath. Repeat echo demonstrated normal EF. He was placed on colchicine and ibuprofen was changed to indomethacin. His beta blocker was stopped secondary to bradycardia. At follow-up in the office, he continued to complain of shortness of breath and was tachycardic. Chest CT was negative for pulmonary embolism. Last seen by Dr. Marlou Porch 09/26/16. He has also been seen by Dr. Jalene Mullet of cardiology at [facility]. Dr. Jalene Mullet had placed him back on colchicine. The patient was intolerant to colchicine secondary to diarrhea. As needed follow-up was recommended. Patient continued to complain of dyspnea and was referred to pulmonology.  Of note, the chart indicates the patient requests switching to Dr. Skeet Latch.  He returns for evaluation of shortness of breath.  He has continued to have chest pain and shortness of breath since his initial presentation in Jan.  He has not really had much improvement.  He does think his dyspnea on exertion is s/w worse here recently.  He notes discomfort on the L side of his chest that is sharp and sometimes tight.  He mainly notes chest pain when sitting up.  He denies pleuritic chest pain or chest pain with lying supine.  He denies exertional chest pain.  He notes  dyspnea on exertion with moderate activities.  He also notes 1 pillow orthopnea. Denies PND, edema.  His weights have been stable.  He denies chest pain.  He has been to his PCP and ENT for sinusitis.  He is now on his 2nd round of Prednisone and is currently on Augmentin.    Prior CV studies:   The following studies were reviewed today:  Echo 09/16/16 ([facility]) EF >55, no effusion  48 hour Holter 09/11/16  2.8 second pause at 11:25 PM-asymptomatic  Average heart rate 66 bpm, sinus rhythm  No atrial fibrillation, no adverse arrhythmias detected  Sinus arrhythmia noted.  Rare PVCs, 3.  Chest CTA 09/06/16 IMPRESSION: Negative for pulmonary embolus.  Negative chest CT.  Echo 08/26/16 EF 60-65, no effusion  MRI 08/22/16 IMPRESSION: 1. Normal left ventricular size, thickness and systolic function (LVEF = 58%) with no regional wall motion abnormalities. There is mild diffuse mid epicardial late gadolinium enhancement in the mid anterior, anteroseptal, apical septal and anterior walls. 2. Normal right ventricular size, thickness and systolic function (LVEF = 55%) with no regional wall motion abnormalities. 3.  Normal biatrial size. 4.  Mild tricuspid regurgitation. 5. Normal size of the aortic root and thoracic aorta. 6.  Normal pericardium, no pericardial effusion. 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 No evidence for pericarditis.  Echo 08/22/16 EF 55-60, normal wall motion, normal diastolic function, trivial TR, no effusion  Past  Medical History:  Diagnosis Date  . Chest pain   . Gastroenteritis     Past Surgical History:  Procedure Laterality Date  . NO PAST SURGERIES    . WISDOM TOOTH EXTRACTION      Current Medications: Current Meds  Medication Sig  . amoxicillin-clavulanate (AUGMENTIN) 125-31.25 MG/5ML suspension Take 125 mg by mouth 2 (two) times daily.   . colchicine 0.6  MG tablet Take 0.6 mg by mouth daily.  . fluticasone (FLONASE) 50 MCG/ACT nasal spray Place 1 spray into both nostrils 2 (two) times daily.   Marland Kitchen ipratropium (ATROVENT) 0.06 % nasal spray Place 1 spray into both nostrils 2 (two) times daily.   Marland Kitchen loteprednol (LOTEMAX) 0.5 % ophthalmic suspension Place 1 drop into both eyes daily.   . pantoprazole (PROTONIX) 40 MG tablet Take 1 tablet (40 mg total) by mouth daily.  . predniSONE (DELTASONE) 20 MG tablet Take 20 mg by mouth daily with breakfast.   . [DISCONTINUED] metoprolol succinate (TOPROL-XL) 25 MG 24 hr tablet Take 0.5 tablets (12.5 mg total) by mouth 2 (two) times daily as needed.     Allergies:   Patient has no known allergies.   Social History   Social History  . Marital status: Single    Spouse name: N/A  . Number of children: N/A  . Years of education: N/A   Social History Main Topics  . Smoking status: Never Smoker  . Smokeless tobacco: Never Used  . Alcohol use No  . Drug use: No  . Sexual activity: Not Asked   Other Topics Concern  . None   Social History Narrative  . None     Family History  Problem Relation Age of Onset  . Healthy Mother   . CAD Father   . CVA Maternal Grandmother   . CAD Maternal Grandfather      ROS:   Please see the history of present illness.    ROS All other systems reviewed and are negative.   EKGs/Labs/Other Test Reviewed:    EKG:  EKG is  ordered today.  The ekg ordered today demonstrates NSR, HR 62, normal axis, RSR' V1, QTc 373, no ST elevation, NSSTTW changes, no significant change from prior tracing.   Recent Labs: 08/26/2016: ALT 25; B Natriuretic Peptide 18.3; TSH 2.422 08/27/2016: BUN 11; Creatinine, Ser 1.10; Hemoglobin 14.8; Platelets 240; Potassium 3.6; Sodium 137  CRP 08/26/16: < 0.8 ESR 09/06/16: 2 HIV 08/22/16: NR  Recent Lipid Panel No results found for: CHOL, TRIG, HDL, CHOLHDL, VLDL, LDLCALC, LDLDIRECT   Physical Exam:    VS:  BP 100/60   Pulse 62   Ht _0   (1.626 m)   Wt 148 lb (67.1 kg)   SpO2 97%   BMI 25.40 kg/m     Wt Readings from Last 3 Encounters:  10/11/16 148 lb (67.1 kg)  09/26/16 147 lb 12.8 oz (67 kg)  09/09/16 141 lb (64 kg)     Physical Exam  Constitutional: He is oriented to person, place, and time. He appears well-developed and well-nourished. No distress.  HENT:  Head: Normocephalic and atraumatic.  Eyes: No scleral icterus.  Neck: Normal range of motion. No JVD present. Carotid bruit is not present.  Cardiovascular: Normal rate, regular rhythm, S1 normal and S2 normal.  Exam reveals no friction rub.   No murmur heard. Pulmonary/Chest: Effort normal and breath sounds normal. He has no wheezes. He has no rhonchi. He has no rales.  Abdominal: Soft. There is no  splenomegaly. There is no tenderness.  Musculoskeletal: He exhibits no edema.  Lymphadenopathy:       Head (right side): No preauricular, no posterior auricular and no occipital adenopathy present.       Head (left side): No preauricular, no posterior auricular and no occipital adenopathy present.       Right cervical: No superficial cervical, no deep cervical and no posterior cervical adenopathy present.      Left cervical: No superficial cervical, no deep cervical and no posterior cervical adenopathy present.  Neurological: He is alert and oriented to person, place, and time.  Skin: Skin is warm and dry.  Psychiatric: He has a normal mood and affect.    ASSESSMENT:    1. SOB (shortness of breath)   2. Myocarditis, unspecified chronicity, unspecified myocarditis type ([place])    PLAN:    In order of problems listed above:  1. Shortness of breath - He continues to have symptoms of chest pain and shortness of breath since he was dx with myocarditis in Jan.  He feels his dyspnea is s/w worse recently.  He is also being tx for sinusitis.  I am not certain how this is impacting his symptoms.  He does not appear volume overloaded.  I will get a CXR, BNP, CXR, CBC,  BMET today.    2. Myocarditis - I did a review on Up To Date today regarding management of Myocarditis.  The biggest concern is for dilated CM and arrhythmias.  Tx generally consists of general measures with management of HF and arrhythmias.  Generally, beta-blocker and ACE inhibitor is felt to be beneficial.  NSAIDs should be avoided.  I could not find any indication for Colchicine (unlike pericarditis management).  Of note, there was a limited/small study that suggested possible benefit with immunosuppressive Rx in auto-reactive disorders (prednisone and azathioprine).  Interestingly, he is currently on prednisone for his sinusitis.  He notes symptoms that sound c/w congestive heart failure but his exam does not suggest this.  He has had several echocardiograms with demonstration of normal LVF.  However, I think we need to reassess his LVF given his current symptoms.  He thinks he may feel better on the days he takes Metoprolol (he has only been taking it for palpitations prn).  His BP is will not tolerate the addition of an ACE inhibitor at this time.    -  Limited Echo to r/o DCM  -  BMET, BNP, CBC  -  CXR  -  Ok to continue colchicine for now.  May want to consider [place] it at FU.  -  Should be ok to continue Prednisone  -  Return for sooner FU if symptoms worsen.   Dispo - FU with Dr. Skeet Latch 10/22/16 as planned.  I will review with her further.  I question if it would be beneficial for him to be seen at the [facility] for management.  At this point, I would not think he needs that. However, if his EF is down, it may be the best course.     Medication Adjustments/Labs and Tests Ordered: Current medicines are reviewed at length with the patient today.  Concerns regarding medicines are outlined above.  Medication changes, Labs and Tests ordered today are outlined in the Patient Instructions noted below. Patient Instructions  Medication Instructions:  1. START TAKING THE TOPROL XL 12.5 MG  ONCE A DAY, EVERY DAY  Labwork: 1. TODAY BMET, CBC , PRO BNP  Testing/Procedures: 1. Your  physician has requested that you have an echocardiogram. Echocardiography is a painless test that uses sound waves to create images of your heart. It provides your doctor with information about the size and shape of your heart and how well your heart's chambers and valves are working. This procedure takes approximately one hour. There are no restrictions for this procedure. THIS WILL NEED TO BE DONE BEFORE APPT WITH DR. [place]   2. A chest x-ray takes a picture of the organs and structures inside the chest, including the heart, lungs, and blood vessels. This test can show several things, including, whether the heart is enlarges; whether fluid is building up in the lungs; and whether pacemaker / defibrillator leads are still in place. [facility] [place]  Follow-Up: KEEP YOUR APPT WITH DR. Homewood 10/22/16  Any Other Special Instructions Will Be Listed Below (If Applicable).  If you need a refill on your cardiac medications before your next appointment, please call your pharmacy.   Return in about 11 days (around 10/22/2016) for Close Follow Up with Dr. Oval Linsey.   Signed, [name], PA-C  10/11/2016 9:54 AM    [facility] [address], [place], [place]  [address] Phone: [phone]; Fax: [phone]

## 2016-10-11 ENCOUNTER — Encounter: Payer: Self-pay | Admitting: Physician Assistant

## 2016-10-11 ENCOUNTER — Telehealth: Payer: Self-pay | Admitting: *Deleted

## 2016-10-11 ENCOUNTER — Ambulatory Visit (INDEPENDENT_AMBULATORY_CARE_PROVIDER_SITE_OTHER): Payer: BLUE CROSS/BLUE SHIELD | Admitting: Physician Assistant

## 2016-10-11 ENCOUNTER — Telehealth: Payer: Self-pay | Admitting: Physician Assistant

## 2016-10-11 ENCOUNTER — Ambulatory Visit
Admission: RE | Admit: 2016-10-11 | Discharge: 2016-10-11 | Disposition: A | Discharge status: Home / Self Care | Payer: BLUE CROSS/BLUE SHIELD | Source: Ambulatory Visit | Attending: Physician Assistant | Admitting: Physician Assistant

## 2016-10-11 VITALS — BP 100/60 | HR 62 | Ht 64.0 in | Wt 148.0 lb

## 2016-10-11 DIAGNOSIS — R0602 Shortness of breath: Secondary | ICD-10-CM

## 2016-10-11 DIAGNOSIS — I514 Myocarditis, unspecified: Secondary | ICD-10-CM

## 2016-10-11 MED ORDER — METOPROLOL SUCCINATE ER 25 MG PO TB24: 25.0000 mg | ORAL_TABLET | Freq: Every day | ORAL | 3 refills | Status: DC

## 2016-10-11 MED ORDER — METOPROLOL SUCCINATE ER 25 MG PO TB24: 12.5000 mg | ORAL_TABLET | Freq: Every day | ORAL | 3 refills | Status: DC

## 2016-10-11 NOTE — Patient Instructions (Addendum)
Medication Instructions:  1. START TAKING THE TOPROL XL 12.5 MG ONCE A DAY, EVERY DAY  Labwork: 1. TODAY BMET, CBC , PRO BNP  Testing/Procedures: 1. Your physician has requested that you have an echocardiogram. Echocardiography is a painless test that uses sound waves to create images of your heart. It provides your doctor with information about the size and shape of your heart and how well your heart's chambers and valves are working. This procedure takes approximately one hour. There are no restrictions for this procedure. THIS WILL NEED TO BE DONE BEFORE APPT WITH DR. Terrell   2. A chest x-ray takes a picture of the organs and structures inside the chest, including the heart, lungs, and blood vessels. This test can show several things, including, whether the heart is enlarges; whether fluid is building up in the lungs; and whether pacemaker / defibrillator leads are still in place. [facility] [address]  Follow-Up: KEEP YOUR APPT WITH DR. Lucas 10/22/16  Any Other Special Instructions Will Be Listed Below (If Applicable).  If you need a refill on your cardiac medications before your next appointment, please call your pharmacy.

## 2016-10-11 NOTE — Telephone Encounter (Signed)
New message  Pt c/o medication issue:  1. Name of Medication: Metoprolol  2. How are you currently taking this medication (dosage and times per day)? 25mg    3. Are you having a reaction (difficulty breathing--STAT)? no  4. What is your medication issue? Marchelle FolksAmanda from [facility] pharmacy call requesting to speak with RN. She states she received to instruction list for medication. She would like to know if pt needs to take 1 pill daily or 1/2? Please call back to discuss

## 2016-10-11 NOTE — Telephone Encounter (Signed)
I s/w Marchelle FolksAmanda, Pharmacist at [facility] and confirmed pt is on Metoprolol Succinate 12.5 mg daily.

## 2016-10-11 NOTE — Telephone Encounter (Signed)
Lmtcb to go over CXR and lab results. CBC is not in yet.

## 2016-10-12 LAB — CBC
Hematocrit: 45 % (ref 37.5–51.0)
Hemoglobin: 15.4 g/dL (ref 13.0–17.7)
MCH: 31.2 pg (ref 26.6–33.0)
MCHC: 34.2 g/dL (ref 31.5–35.7)
MCV: 91 fL (ref 79–97)
PLATELETS: 259 10*3/uL (ref 150–379)
RBC: 4.94 x10E6/uL (ref 4.14–5.80)
RDW: 13.1 % (ref 12.3–15.4)
WBC: 15.7 10*3/uL — AB (ref 3.4–10.8)

## 2016-10-12 LAB — BASIC METABOLIC PANEL
BUN/Creatinine Ratio: 10 (ref 9–20)
BUN: 12 mg/dL (ref 6–20)
CALCIUM: 9.4 mg/dL (ref 8.7–10.2)
CHLORIDE: 96 mmol/L (ref 96–106)
CO2: 25 mmol/L (ref 18–29)
Creatinine, Ser: 1.2 mg/dL (ref 0.76–1.27)
GFR calc Af Amer: 97 mL/min/{1.73_m2} (ref >59)
GFR, EST NON AFRICAN AMERICAN: 84 mL/min/{1.73_m2} (ref >59)
GLUCOSE: 73 mg/dL (ref 65–99)
POTASSIUM: 4 mmol/L (ref 3.5–5.2)
Sodium: 141 mmol/L (ref 134–144)

## 2016-10-12 LAB — PRO B NATRIURETIC PEPTIDE: NT-PRO BNP: 21 pg/mL (ref 0–86)

## 2016-10-14 NOTE — Telephone Encounter (Signed)
Follow up    [facility]

## 2016-10-14 NOTE — Telephone Encounter (Signed)
Follow Up: ° ° ° ° ° °Returning your call from Friday. °

## 2016-10-14 NOTE — Telephone Encounter (Signed)
Reviewed with Dr. Anne FuSkains and no changes needed at this time.  Continue colchicine and ibuprofen as needed.  I spoke with pt and gave him this information.  I told him to keep appt for echo on 10/17/16

## 2016-10-14 NOTE — Telephone Encounter (Signed)
Lmtcb to go over lab and cxr results

## 2016-10-14 NOTE — Telephone Encounter (Signed)
Pt spoke with Danielle Rankinarol Fiato CMA this AM to give labs and Cx ray  Results. Pt C/O to her that he has been having chest pain, arm pain and SOB all day today. Pt was seen by Tereso Newcomerscott Weaver on 10/11/16. And he was having the same chest pain, and SOB. When I spoke to pt he states that the chest pain and SOB are  the same as before. The pain on his right arm is different and is constant. The pain has been going on since he got up this morning  Score of "7" pt is taken Ibuprofen and colchicine now. Pt has been having a head cold for a month.  .Marland Kitchen

## 2016-10-14 NOTE — Telephone Encounter (Signed)
I s/w pt and went over cxr and lab results by phone with verbal understanding. Pt aware WBC elevated most likely due to recent prednisone use. I will fax results to PCP. Pt said thank you and then proceeded to tell me that he has had chest pain and right arm pain all morning long today. I asked scale 1-10 with 10 being the worst what would he say his cp is, pt answered 7 and rates his right arm a 9. Pt also c/o sob all weekend. Pt was in the hospital 1/18 with Viral myocarditis in the setting of mononucleosis. Pt did have elevated Troponin levels at that time, normal EF on echo.  I noticed that pt sounded nasally , I proceeded to asked pt if he had a cold which he answered yes. I advised pt that in the setting of the chest pain and arm pain, I will have a Triage nurse call him to further evaluate. Pt said thank you. [facility] since pt was seen by Bing NeighborsScott W. PA.

## 2016-10-15 ENCOUNTER — Encounter: Payer: Self-pay | Admitting: Internal Medicine

## 2016-10-15 ENCOUNTER — Ambulatory Visit (INDEPENDENT_AMBULATORY_CARE_PROVIDER_SITE_OTHER): Payer: BLUE CROSS/BLUE SHIELD | Admitting: Internal Medicine

## 2016-10-15 VITALS — BP 106/70 | HR 64 | Ht 64.0 in | Wt 147.0 lb

## 2016-10-15 DIAGNOSIS — R0602 Shortness of breath: Secondary | ICD-10-CM | POA: Diagnosis not present

## 2016-10-15 DIAGNOSIS — R0600 Dyspnea, unspecified: Secondary | ICD-10-CM | POA: Insufficient documentation

## 2016-10-15 MED ORDER — FAMOTIDINE 20 MG PO TABS: ORAL_TABLET | ORAL | 2 refills | Status: DC

## 2016-10-15 NOTE — Progress Notes (Signed)
Subjective:     Patient ID: Joshua Christensen, male   DOB: [birth year],    MRN: [id number]  HPI  25 yobm never smoker good ex tol but not aerobically active/ works as Production designer, theatre/television/filmmanager at [employer] [place] and auctely ill on Aug 21 2016 with mono/myocarditis   Admission date:  08/25/2016  Admitting Physician  Eduard ClosArshad N Kakrakandy, MD  Discharge Date:  08/27/2016    Admission Diagnosis  Hypoxia [R09.02]  Discharge Diagnosis  Hypoxia [R09.02]   Principal Problem:   Acute respiratory failure with hypoxia [facility](HCC) Active Problems:   Myocarditis (HCC)     Kindly see H&P for history of present illness and admission details, please review complete Labs, Consult reports and Test reports for all details in brief  HPI  from the history and physical done on the day of admission 08/26/2016 HPI: Joshua Clementsis a 25 y.o.malewith with recent admission for viral myocarditis discharged3 days ago presents to the ER because of shortness of breath. Patient states since discharge his shortness of breath has been increasing. Denies any chest pain. Shortness of breath is present even at rest increases on exertion. Denies any productive cough fever or chills. Chest x-ray and EKG in the ER were unremarkable. D-dimer was negative. ER physician had discussed with on-call cardiologist who recommended admission for observation and repeating 2-D echo.  ED Course:Chest x-ray and EKG were unremarkable D level was negative troponin was negative.   Hospital Course  25 y.o.malewith with recent admission for viral myocarditis discharged3 days ago presents to the ER because of shortness of breath  Dyspnea - Patient presents with dyspnea, recent history of acute viral myocarditis, no evidence of volume overload, respiratory distress, no wheezing, has been ambulated on the hallway multiple times with no hypoxia or tachycardia, significant anxiety, asking for oxygen even at rest. - Cardiology input greatly  appreciated,  2-D echo 1/8 was unremarkable, empirically on colchicine for possible pericarditis, to take for 1 month, ibuprofen and changed to indomethacin 50 mg twice a day for 1 month per cardiology recommendation, to continue with Protonix, Coreg has been stopped for bradycardia.  Acute myocarditis - Recently discharged, mild case on cardiac MRI, normal EF, repeat 2-D echo unremarkable, on colchicine, indomethacin and Protonix, beta blocker stopped for bradycardia     10/15/2016 1st [place] office visit/ Joshua Christensen   Chief Complaint  Patient presents with  . Pulmonary Consult    Referred by Dr. Donato SchultzMark Skains. Pt c/o SOB since Jan 2018 after dx of viral myocarditis. He states he feels SOB all of the time, with or without any exertion. He also c/o occ cough and CP. He states that he currently has a sinus infection.   since acute onset in Jan dx as mono = lying flat and sleeping is only position with no sob / not disturbing sleep at all, aware of sob whenever awake and not proportionate to activity. Also has developed persistent snorting over the same time frame seeing two different sets of ent "it's no better"  (says told it was a sinus infection but has no symptoms of this dx other than the snorting)   No obvious day to day or daytime variability or assoc excess/ purulent sputum or mucus plugs or hemoptysis or cp or chest tightness, subjective wheeze or overt  hb symptoms. No unusual exp hx or h/o childhood pna/ asthma or knowledge of premature birth.  Sleeping ok without nocturnal  or early am exacerbation  of respiratory  c/o's or need for  noct saba. Also denies any obvious fluctuation of symptoms with weather or environmental changes or other aggravating or alleviating factors except as outlined above   Current Medications, Allergies, Complete Past Medical History, Past Surgical History, Family History, and Social History were reviewed in [employer] record.  ROS   The following are not active complaints unless bolded sore throat, dysphagia, dental problems, itching, sneezing,  nasal congestion  X one month rx augmentin/pred d/c turned white  or excess/ purulent secretions, ear ache,   fever, chills, sweats, unintended wt loss, classically pleuritic or exertional cp,  orthopnea pnd or leg swelling, presyncope, palpitations, abdominal pain, anorexia, nausea, vomiting, diarrhea  or change in bowel or bladder habits, change in stools or urine, dysuria,hematuria,  rash, arthralgias, visual complaints, headache, numbness, weakness or ataxia or problems with walking or coordination,  change in mood/affect or memory.              Review of Systems     Objective:   Physical Exam    amb bm snorting during interview/ exam    Wt Readings from Last 3 Encounters:  10/15/16 147 lb (66.7 kg)  10/11/16 148 lb (67.1 kg)  09/26/16 147 lb 12.8 oz (67 kg)    Vital signs reviewed  - Note on arrival 02 sats  99% on RA     HEENT: nl dentition, turbinates bilaterally, and oropharynx. Nl external ear canals without cough reflex   NECK :  without JVD/Nodes/TM/ nl carotid upstrokes bilaterally   LUNGS: no acc muscle use,  Nl contour chest which is clear to A and P bilaterally without cough on insp or exp maneuvers   CV:  RRR  no s3 or murmur or increase in P2, and no edema   ABD:  soft and nontender with nl inspiratory excursion in the supine position. No bruits or organomegaly appreciated, bowel sounds nl  MS:  Nl gait/ ext warm without deformities, calf tenderness, cyanosis or clubbing No obvious joint restrictions   SKIN: warm and dry without lesions    NEURO:  alert, approp, nl sensorium with  no motor or cerebellar deficits apparent.     I personally reviewed images and agree with radiology impression as follows:  CTa Chest  09/06/16 Negative for pulmonary embolus.  Negative chest CT.   Labs  reviewed:      Chemistry      Component Value  Date/Time   NA 141 10/11/2016 1051   K 4.0 10/11/2016 1051   CL 96 10/11/2016 1051   CO2 25 10/11/2016 1051   BUN 12 10/11/2016 1051   CREATININE 1.20 10/11/2016 1051      Component Value Date/Time   CALCIUM 9.4 10/11/2016 1051   ALKPHOS 72 08/26/2016 0605   AST 27 08/26/2016 0605   ALT 25 08/26/2016 0605   BILITOT 0.6 08/26/2016 0605        Lab Results  Component Value Date   WBC 15.7 (H) 10/11/2016   HGB 14.8 08/27/2016   HCT 45.0 10/11/2016   MCV 91 10/11/2016   PLT 259 10/11/2016     Lab Results  Component Value Date   DDIMER 0.29 08/26/2016      Lab Results  Component Value Date   TSH 2.422 08/26/2016     Lab Results  Component Value Date   PROBNP 21 10/11/2016       Lab Results  Component Value Date   ESRSEDRATE 2 09/06/2016       Assessment:

## 2016-10-15 NOTE — Patient Instructions (Addendum)
Pantoprazole (protonix) 40 mg   Take  30-60 min before first meal of the day and Pepcid (famotidine)  20 mg one @  bedtime until return to office - this is the best way to tell whether stomach acid is contributing to your problem.    GERD (REFLUX)  is an extremely common cause of respiratory symptoms just like yours , many times with no obvious heartburn at all.    It can be treated with medication, but also with lifestyle changes including elevation of the head of your bed (ideally with 6 inch  bed blocks),  Smoking cessation, avoidance of late meals, excessive alcohol, and avoid fatty foods, chocolate, peppermint, colas, red wine, and acidic juices such as orange juice.  NO MINT OR MENTHOL PRODUCTS SO NO COUGH DROPS   USE SUGARLESS CANDY INSTEAD (Jolley ranchers or Stover's or Life Savers) or even ice chips will also do - the key is to swallow to prevent all throat clearing. NO OIL BASED VITAMINS - use powdered substitutes.    To get the most out of exercise, you need to be continuously aware that you are short of breath, but never out of breath, for 30 minutes daily. As you improve, it will actually be easier for you to do the same amount of exercise  in  30 minutes so always push to the level where you are short of breath.    Pulmonary follow up is as needed

## 2016-10-16 NOTE — Assessment & Plan Note (Signed)
Symptoms are markedly disproportionate to objective findings and not clear this is a lung problem but pt does appear to have difficult airway management issues. DDX of  difficult airways management almost all start with A and  include Adherence, Ace Inhibitors, Acid Reflux, Active Sinus Disease, Alpha 1 Antitripsin deficiency, Anxiety masquerading as Airways dz,  ABPA,  Allergy(esp in young), Aspiration (esp in elderly), Adverse effects of meds,  Active smokers, A bunch of PE's (a small clot burden can't cause this syndrome unless there is already severe underlying pulm or vascular dz with poor reserve) plus two Bs  = Bronchiectasis and Beta blocker use..and one C= CHF   Adherence is always the initial "prime suspect" and is a multilayered concern that requires a "trust but verify" approach in every patient - starting with knowing how to use medications, especially inhalers, correctly, keeping up with refills and understanding the fundamental difference between maintenance and prns vs those medications only taken for a very short course and then stopped and not refilled.  - if returns, with all meds in hand using a trust but verify approach to confirm accurate Medication  Reconciliation The principal here is that until we are certain that the  patients are doing what we've asked, it makes no sense to ask them to do more.   ? Acid (or non-acid) GERD > always difficult to exclude as up to 75% of pts in some series report no assoc GI/ Heartburn symptoms> rec max (24h)  acid suppression and diet restrictions/ reviewed and instructions given in writing.   ? Active sinus dz > seeing two different ent's >  Suggest he pick one and complete the w/u  ? Allergy/ asthma > strongly doubt  ? Anxiety > usually at the bottom of this list of usual suspects but should be much higher on this pt's based on H and P >  Follow up per Primary Care planned    ? chf > nothing to suggest this now, myocarditis was mild   Main  thing he needs now is reconditioning/ no pulmonary problem identified  Total time devoted to counseling  > 50 % of initial 60 min office visit:  review case with pt/ discussion of options/alternatives/ personally creating written customized instructions  in presence of pt  then going over those specific  Instructions directly with the pt including how to use all of the meds but in particular covering each new medication in detail and the difference between the maintenance= "automatic" meds and the prns using an action plan format for the latter (If this problem/symptom => do that organization reading Left to right).  Please see AVS from this visit for a full list of these instructions which I personally wrote for this pt and  are unique to this visit.

## 2016-10-17 ENCOUNTER — Ambulatory Visit (HOSPITAL_COMMUNITY)
Admission: RE | Admit: 2016-10-17 | Discharge: 2016-10-17 | Disposition: A | Discharge status: Home / Self Care | Payer: BLUE CROSS/BLUE SHIELD | Source: Ambulatory Visit | Attending: Physician Assistant | Admitting: Physician Assistant

## 2016-10-17 ENCOUNTER — Encounter: Payer: Self-pay | Admitting: Physician Assistant

## 2016-10-17 DIAGNOSIS — I514 Myocarditis, unspecified: Secondary | ICD-10-CM | POA: Diagnosis present

## 2016-10-17 NOTE — Progress Notes (Signed)
  Echocardiogram Echocardiogram Transesophageal has been performed.  Joshua Christensen, Joshua Christensen M 10/17/2016, 9:04 AM

## 2016-10-18 ENCOUNTER — Telehealth: Payer: Self-pay | Admitting: *Deleted

## 2016-10-18 NOTE — Telephone Encounter (Signed)
Patient is your returning call in regards to results, thanks.

## 2016-10-18 NOTE — Telephone Encounter (Signed)
Lmtcb to go over echo results.  

## 2016-10-18 NOTE — Telephone Encounter (Signed)
Joshua Christensen and has been notified of Limited Echo results by phone. Pt advised to keep appt with Dr. Chilton Siiffany . I will fax a copy of results to PCP which at this time pt advised me of his new PCP. I will update his chart. New PCP is : Cletis MediaMichael Carroll, PA-C [address] [place], [place] [address] Phone: [phone] Fax: [phone]

## 2016-10-18 NOTE — Telephone Encounter (Signed)
Ptcb about 1 hour ago. I tried to return pt's call back to go over results, lmtcb.

## 2016-10-22 ENCOUNTER — Ambulatory Visit (INDEPENDENT_AMBULATORY_CARE_PROVIDER_SITE_OTHER): Payer: BLUE CROSS/BLUE SHIELD | Admitting: Cardiovascular Disease

## 2016-10-22 ENCOUNTER — Encounter: Payer: Self-pay | Admitting: Cardiovascular Disease

## 2016-10-22 VITALS — BP 124/71 | HR 64 | Ht 64.0 in | Wt 149.0 lb

## 2016-10-22 DIAGNOSIS — R0602 Shortness of breath: Secondary | ICD-10-CM

## 2016-10-22 DIAGNOSIS — I514 Myocarditis, unspecified: Secondary | ICD-10-CM | POA: Diagnosis not present

## 2016-10-22 MED ORDER — IBUPROFEN 200 MG PO TABS: 200.0000 mg | ORAL_TABLET | Freq: Four times a day (QID) | ORAL | 1 refills | Status: DC | PRN

## 2016-10-22 MED ORDER — COLCHICINE 0.6 MG PO TABS: 0.6000 mg | ORAL_TABLET | Freq: Every day | ORAL | 0 refills | Status: DC

## 2016-10-22 NOTE — Patient Instructions (Addendum)
Medication Instructions:  STOP COLCHICINE ON April 1  Labwork: NONE  Testing/Procedures: NONE  Follow-Up: Your physician recommends that you schedule a follow-up appointment in: MID-LATE April   YOU ARE OK FROM A CARDIAC STANDPOINT FOR SURGERY   If you need a refill on your cardiac medications before your next appointment, please call your pharmacy.

## 2016-10-22 NOTE — Progress Notes (Signed)
Cardiology Office Note   Date:  10/22/2016   ID:  Apollo Timothy, DOB 02-14-1991, MRN [id number]  PCP:  Milford Cage, PA  Cardiologist:   Skeet Latch, MD   Chief Complaint  Patient presents with  . New Patient (Initial Visit)      History of Present Illness: Einer Whittlesey is a 26 y.o. male with myocarditis who presents for follow up.  Mr. Carne developed viral myocarditis in the setting of mononucleosis.  He was admitted 08/2015 with chest pain and elevated troponin.  Echo revealed LVEF 55-60% and was otherwise unremarkable.  He had a cardiac MRI 08/22/16 that revealed mild diffuse late gadolinium enhancement consistnet with myocarditis.  He was started on beta blockers and NSAIDS.  He was readmitted days later with shortness of breath.  Echo was unchanged and his beta blocker was stopped 2/2 bradycardia.  Ibuprofen was switched to indomethacin.  He followed up with Dr. Marlou Porch and continued to complain of shortness of breath.  He had a chest CT that was negative for PE.  He was seen by Dr. Jalene Mullet at [facility] who put him back on colchicine.  However he stopped this due to diarrhea.  He followed up with [name] on 2/23 and continued to note chest pain and shortness of breath.  In the interim he was treated with prednisone and Augmentin for sinusitis.  At that appointment there was no evidence of heart failure.  He had a repeat echo 10/17/16 that was normal.  BNP was normal.  WBC was elevated, though he was on prednisone.  Cardiac enzymes were within normal limits and ESR was normal.  Mr. Sahr reports that his breathing is starting to improve. He continues to endorse orthopnea and exertional shortness of breath. He has not noted any lower extremity edema. He also reports that he sometimes snorts when talking, which is new since his illness.  He has started to go back to work slowly.  He works as a Freight forwarder at [employer] and does not have to exert himself.  He continues to follow  up with an ENT who thinks that he may need either his uvula trimmed or to have his tonsils removed.  He denies heavy snoring.     Past Medical History:  Diagnosis Date  . Chest pain   . Gastroenteritis   . History of echocardiogram    Echo 3/18: GLS -17.9, EF 50-55, mild diff HK    Past Surgical History:  Procedure Laterality Date  . NO PAST SURGERIES    . WISDOM TOOTH EXTRACTION       Current Outpatient Prescriptions  Medication Sig Dispense Refill  . colchicine 0.6 MG tablet Take 1 tablet (0.6 mg total) by mouth daily. 30 tablet 0  . famotidine (PEPCID) 20 MG tablet One at bedtime 30 tablet 2  . fluticasone (FLONASE) 50 MCG/ACT nasal spray Place 1 spray into both nostrils 2 (two) times daily.     Marland Kitchen ibuprofen (ADVIL,MOTRIN) 200 MG tablet Take 1 tablet (200 mg total) by mouth every 6 (six) hours as needed. 90 tablet 1  . ipratropium (ATROVENT) 0.06 % nasal spray Place 1 spray into both nostrils 2 (two) times daily.     . metoprolol succinate (TOPROL-XL) 25 MG 24 hr tablet Take 0.5 tablets (12.5 mg total) by mouth daily. 90 tablet 3  . pantoprazole (PROTONIX) 40 MG tablet Take 1 tablet (40 mg total) by mouth daily. 30 tablet 0   No current facility-administered medications for this  visit.     Allergies:   Patient has no known allergies.    Social History:  The patient  reports that he has never smoked. He has never used smokeless tobacco. He reports that he does not drink alcohol or use drugs.   Family History:  The patient's family history includes CAD in his father and maternal grandfather; CVA in his maternal grandmother; Healthy in his mother.    ROS:  Please see the history of present illness.   Otherwise, review of systems are positive for none.   All other systems are reviewed and negative.    PHYSICAL EXAM: VS:  BP 124/71   Pulse 64   Ht 5' 4"  (1.626 m)   Wt 67.6 kg (149 lb)   BMI 25.58 kg/m  , BMI Body mass index is 25.58 kg/m. GENERAL:  Well  appearing HEENT:  Pupils equal round and reactive, fundi not visualized, oral mucosa unremarkable NECK:  No jugular venous distention, waveform within normal limits, carotid upstroke brisk and symmetric, no bruits, no thyromegaly LYMPHATICS:  No cervical adenopathy LUNGS:  Clear to auscultation bilaterally HEART:  RRR.  PMI not displaced or sustained,S1 and S2 within normal limits, no S3, no S4, no clicks, no rubs, no murmurs ABD:  Flat, positive bowel sounds normal in frequency in pitch, no bruits, no rebound, no guarding, no midline pulsatile mass, no hepatomegaly, no splenomegaly EXT:  2 plus pulses throughout, no edema, no cyanosis no clubbing SKIN:  No rashes no nodules NEURO:  Cranial nerves II through XII grossly intact, motor grossly intact throughout PSYCH:  Cognitively intact, oriented to person place and time   EKG:  EKG is not ordered today.   Echo 09/16/16 ([facility]) EF >55, no effusion  48 hour Holter 09/11/16  2.8 second pause at 11:25 PM-asymptomatic  Average heart rate 66 bpm, sinus rhythm  No atrial fibrillation, no adverse arrhythmias detected  Sinus arrhythmia noted.  Rare PVCs, 3.  Chest CTA 09/06/16 IMPRESSION: Negative for pulmonary embolus. Negative chest CT.  Echo 08/26/16 EF 60-65, no effusion  MRI 08/22/16 IMPRESSION: 1. Normal left ventricular size, thickness and systolic function (LVEF = 58%) with no regional wall motion abnormalities. There is mild diffuse mid epicardial late gadolinium enhancement in the mid anterior, anteroseptal, apical septal and anterior walls. 2. Normal right ventricular size, thickness and systolic function (LVEF = 55%) with no regional wall motion abnormalities. 3. Normal biatrial size. 4. Mild tricuspid regurgitation. 5. Normal size of the aortic root and thoracic aorta. 6. Normal pericardium, no pericardial effusion. Collectively, these findings are consistent with an acute myocarditis with involvement of 4 segments  and preserved biventricular function. No evidence for pericarditis.  Echo 08/22/16 EF 55-60, normal wall motion, normal diastolic function, trivial TR, no effusion   Recent Labs: 08/26/2016: ALT 25; B Natriuretic Peptide 18.3; TSH 2.422 08/27/2016: Hemoglobin 14.8 10/11/2016: BUN 12; Creatinine, Ser 1.20; NT-Pro BNP 21; Platelets 259; Potassium 4.0; Sodium 141    Lipid Panel No results found for: CHOL, TRIG, HDL, CHOLHDL, VLDL, LDLCALC, LDLDIRECT    Wt Readings from Last 3 Encounters:  10/22/16 67.6 kg (149 lb)  10/15/16 66.7 kg (147 lb)  10/11/16 67.1 kg (148 lb)      ASSESSMENT AND PLAN:  # Viral myocarditis: Resolved.  Multiple echocardiograms have been unremarkable and his BNP is within normal limits.  I do not think that this is contributing to his shortness of breath.  We will plan for a three-month course of colchicine to  help prevent recurrence. Stop colchicine April 1st, which will complete a 3 month course.  If he continues to improve after making this change, we will stop metoprolol next. Continue ibuprofen as needed.  We discussed the fact that it can take months to fully recover from mononucleosis.    # Shortness of breath: this does not seem to be attributable to his heart. He has normal systolic function and diastolic function. He has no evidence of heart failure on exam.  Continue follow up with ENT and pulmonary as needed.    Current medicines are reviewed at length with the patient today.  The patient does not have concerns regarding medicines.  The following changes have been made:  no change  Labs/ tests ordered today include:  No orders of the defined types were placed in this encounter.    Disposition:   FU with Chidiebere Wynn C. Oval Linsey, MD, [facility] in 6 weeks    This note was written with the assistance of speech recognition software.  Please excuse any transcriptional errors.  Signed, Marisella Puccio C. Oval Linsey, MD, [facility]  10/22/2016 1:30 PM    [place]

## 2016-12-02 ENCOUNTER — Institutional Professional Consult (permissible substitution): Payer: Self-pay | Admitting: Neurology

## 2016-12-03 ENCOUNTER — Telehealth: Payer: Self-pay

## 2016-12-03 NOTE — Telephone Encounter (Signed)
LM for patient to call back and r/s appt due to bad weather  

## 2016-12-06 ENCOUNTER — Encounter: Payer: Self-pay | Admitting: Cardiovascular Disease

## 2016-12-06 ENCOUNTER — Ambulatory Visit (INDEPENDENT_AMBULATORY_CARE_PROVIDER_SITE_OTHER): Payer: BLUE CROSS/BLUE SHIELD | Admitting: Cardiovascular Disease

## 2016-12-06 VITALS — BP 110/60 | HR 68 | Ht 64.0 in | Wt 151.0 lb

## 2016-12-06 DIAGNOSIS — R0602 Shortness of breath: Secondary | ICD-10-CM

## 2016-12-06 DIAGNOSIS — R0789 Other chest pain: Secondary | ICD-10-CM | POA: Diagnosis not present

## 2016-12-06 DIAGNOSIS — I514 Myocarditis, unspecified: Secondary | ICD-10-CM | POA: Diagnosis not present

## 2016-12-06 NOTE — Progress Notes (Signed)
Cardiology Office Note   Date:  12/06/2016   ID:  Joshua Christensen, DOB [birth year], MRN [id number]  PCP:  Milford Cage, PA  Cardiologist:   Skeet Latch, MD   Chief Complaint  Patient presents with  . Follow-up    follow up testing, having some shortness of breath while sitting and doing nothing       History of Present Illness: Joshua Christensen is a 26 y.o. male with myocarditis who presents for follow up.  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  He was admitted 08/2015 with chest pain and elevated troponin.  Echo revealed LVEF 55-60% and was otherwise unremarkable.  He had a cardiac MRI 08/22/16 that revealed mild diffuse late gadolinium enhancement consistnet with myocarditis.  He was started on beta blockers and NSAIDS.  He was readmitted days later with shortness of breath.  Echo was unchanged and his beta blocker was stopped 2/2 bradycardia.  Ibuprofen was switched to indomethacin.  He followed up with Dr. Marlou Christensen and continued to complain of shortness of breath.  He had a chest CT that was negative for PE.  He was seen by Dr. Jalene Christensen at [facility] who put him back on colchicine.  However he stopped this due to diarrhea.  He followed up with Joshua Christensen on 2/23 and continued to note chest pain and shortness of breath.  In the interim he was treated with prednisone and Augmentin for sinusitis.  At that appointment there was no evidence of heart failure.  He had a repeat echo 10/17/16 that was normal.  BNP was normal.  WBC was elevated, though he was on prednisone.  Cardiac enzymes were within normal limits and ESR was normal.  Joshua Christensen has been doing well.  He Reports shortness of breath at rest. However he is able to exercise and has no shortness of breath with exertion. He also denies lower extremity edema, orthopnea, or PND. His chest pain is much better. He occasionally has right-sided sharp chest pain her they last for a few moments and time.  He has no exertional chest pain. He continues to have episodes of snoring and snorting and is following up with his ENT.   Past Medical History:  Diagnosis Date  . Chest pain   . Gastroenteritis   . History of echocardiogram    Echo 3/18: GLS -17.9, EF 50-55, mild diff HK    Past Surgical History:  Procedure Laterality Date  . NO PAST SURGERIES    . WISDOM TOOTH EXTRACTION       Current Outpatient Prescriptions  Medication Sig Dispense Refill  . ibuprofen (ADVIL,MOTRIN) 200 MG tablet Take 1 tablet (200 mg total) by mouth every 6 (six) hours as needed. 90 tablet 1   No current facility-administered medications for this visit.     Allergies:   Patient has no known allergies.    Social History:  The patient  reports that he has never smoked. He has never used smokeless tobacco. He reports that he does not drink alcohol or use drugs.   Family History:  The patient's family history includes CAD in his father and maternal grandfather; CVA in his maternal grandmother; Healthy in his mother.    ROS:  Please see the history of present illness.   Otherwise, review of systems are positive for none.   All other systems are reviewed and negative.    PHYSICAL EXAM: VS:  BP 110/60   Pulse 68   Ht 5'  4" (1.626 m)   Wt 68.5 kg (151 lb)   BMI 25.92 kg/m  , BMI Body mass index is 25.92 kg/m. GENERAL:  Well appearing.  No acute distress HEENT:  Pupils equal round and reactive, fundi not visualized, oral mucosa unremarkable NECK:  No jugular venous distention, waveform within normal limits, carotid upstroke brisk and symmetric, no bruits LYMPHATICS:  No cervical adenopathy LUNGS:  Clear to auscultation bilaterally HEART:  RRR.  PMI not displaced or sustained,S1 and S2 within normal limits, no S3, no S4, no clicks, no rubs, no murmurs ABD:  Flat, positive bowel sounds normal in frequency in pitch, no bruits, no rebound, no guarding, no midline pulsatile mass, no hepatomegaly, no  splenomegaly EXT:  2 plus pulses throughout, no edema, no cyanosis no clubbing SKIN:  No rashes no nodules NEURO:  Cranial nerves II through XII grossly intact, motor grossly intact throughout PSYCH:  Cognitively intact, oriented to person place and time   EKG:  EKG is not ordered today.   Echo 09/16/16 ([facility]) EF >55, no effusion  48 hour Holter 09/11/16  2.8 second pause at 11:25 PM-asymptomatic  Average heart rate 66 bpm, sinus rhythm  No atrial fibrillation, no adverse arrhythmias detected  Sinus arrhythmia noted.  Rare PVCs, 3.  Chest CTA 09/06/16 IMPRESSION: Negative for pulmonary embolus. Negative chest CT.  Echo 08/26/16 EF 60-65, no effusion  MRI 08/22/16 IMPRESSION: 1. Normal left ventricular size, thickness and systolic function (LVEF = 58%) with no regional wall motion abnormalities. There is mild diffuse mid epicardial late gadolinium enhancement in the mid anterior, anteroseptal, apical septal and anterior walls. 2. Normal right ventricular size, thickness and systolic function (LVEF = 55%) with no regional wall motion abnormalities. 3. Normal biatrial size. 4. Mild tricuspid regurgitation. 5. Normal size of the aortic root and thoracic aorta. 6. Normal pericardium, no pericardial effusion. 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 No evidence for pericarditis.  Echo 08/22/16 EF 55-60, normal wall motion, normal diastolic function, trivial TR, no effusion   Recent Labs: 08/26/2016: ALT 25; B Natriuretic Peptide 18.3; TSH 2.422 08/27/2016: Hemoglobin 14.8 10/11/2016: BUN 12; Creatinine, Ser 1.20; NT-Pro BNP 21; Platelets 259; Potassium 4.0; Sodium 141    Lipid Panel No results found for: CHOL, TRIG, HDL, CHOLHDL, VLDL, LDLCALC, LDLDIRECT    Wt Readings from Last 3 Encounters:  12/06/16 68.5 kg (151 lb)  10/22/16 67.6 kg (149 lb)  10/15/16 66.7 kg (147  lb)      ASSESSMENT AND PLAN:  # Viral myocarditis: # Shortness of breath: Resolved.  Multiple echocardiograms have been unremarkable and his BNP is within normal limits.  He reports shortness of breath at rest but none with exertion. This is not consistent with a cardiopulmonary etiology. We will stop his metoprolol and see if this helps.  His chest pain is right sided and not consistent with a cardiac etiology.    Current medicines are reviewed at length with the patient today.  The patient does not have concerns regarding medicines.  The following changes have been made:  Stop metoprolol  Labs/ tests ordered today include:  No orders of the defined types were placed in this encounter.    Disposition:   FU with Dolorez Jeffrey C. Oval Linsey, MD, [facility] in 4 months   This note was written with the assistance of speech recognition software.  Please excuse any transcriptional errors.  Signed, Belle Charlie C. Oval Linsey, MD, [facility]  12/06/2016  8:46 AM    [facility]

## 2016-12-06 NOTE — Patient Instructions (Signed)
Medication Instructions:  D/C METOPROLOL   Labwork: NONE  Testing/Procedures: NONE  Follow-Up: Your physician recommends that you schedule a follow-up appointment in: 4 MONTH OV  If you need a refill on your cardiac medications before your next appointment, please call your pharmacy.

## 2016-12-11 ENCOUNTER — Encounter: Payer: Self-pay | Admitting: Neurology

## 2016-12-11 ENCOUNTER — Ambulatory Visit (INDEPENDENT_AMBULATORY_CARE_PROVIDER_SITE_OTHER): Payer: BLUE CROSS/BLUE SHIELD | Admitting: Neurology

## 2016-12-11 VITALS — BP 141/92 | HR 121 | Ht 65.0 in | Wt 150.0 lb

## 2016-12-11 DIAGNOSIS — B3322 Viral myocarditis: Secondary | ICD-10-CM

## 2016-12-11 DIAGNOSIS — R0683 Snoring: Secondary | ICD-10-CM

## 2016-12-11 DIAGNOSIS — R519 Headache, unspecified: Secondary | ICD-10-CM

## 2016-12-11 DIAGNOSIS — Z8709 Personal history of other diseases of the respiratory system: Secondary | ICD-10-CM

## 2016-12-11 DIAGNOSIS — R351 Nocturia: Secondary | ICD-10-CM

## 2016-12-11 DIAGNOSIS — R0681 Apnea, not elsewhere classified: Secondary | ICD-10-CM | POA: Diagnosis not present

## 2016-12-11 DIAGNOSIS — Z9289 Personal history of other medical treatment: Secondary | ICD-10-CM | POA: Diagnosis not present

## 2016-12-11 DIAGNOSIS — R51 Headache: Secondary | ICD-10-CM | POA: Diagnosis not present

## 2016-12-11 NOTE — Patient Instructions (Signed)

## 2016-12-11 NOTE — Progress Notes (Signed)
Subjective:    Patient ID: Joshua Christensen is a 26 y.o. male.  HPI     Joshua Foley, MD, PhD [facility] [address], [address] [address] [place], [place] [address]  Dear Dr. Haroldine Laws,   I saw your patient, Joshua Christensen upon your kind request in my neurologic clinic today for initial consultation of his sleep disorder, in particular, concern for underlying obstructive sleep apnea. The patient is unaccompanied today. As you know, Joshua Christensen is a 26 year old right-handed gentleman with an underlying medical history of viral myocarditis, right bundle branch block, history of acute respiratory failure with respiratory hypoxia, for which he was hospitalized twice in January 2018, history of renal insufficiency, bradycardia, history of cellulitis, reflux disease, allergic rhinitis, chronic sinusitis, who reports snoring and excessive daytime somnolence. I reviewed your office note from 10/22/2016, which you kindly included.  He was told he has tight nasal passages, may need surgery for this. Has has had nasal congestion and need for mouth breathing since Feb. Has tried nasal steroid, including flonase, which he still uses. Has woken up with SOB and choking feeling, morning headaches, nocturia 2 or 3 times per night on average. He has difficulty falling asleep since his recent hospitalization. He has intermittent to move his legs, perhaps restless leg symptoms. His grandmother has obstructive sleep apnea but does not use her CPAP machine from what I understand. Bedtime is between 11 and 12, wakeup time around 5 AM. He is single, lives alone, has no children, does not smoke, drinks alcohol infrequently, about once every 3 weeks or so, currently no caffeine, stop drinking caffeine since his hospitalization. He had a recent checkup with his cardiologist. Due to his mouth breathing he makes a clicking noise or snorting noise. He feels tired during the day but is not frankly  sleepy enough to doze off. His Epworth sleepiness score is 1 out of 24, fatigue score is 55 out of 63.  His Past Medical History Is Significant For: Past Medical History:  Diagnosis Date  . Chest pain   . Gastroenteritis   . History of echocardiogram    Echo 3/18: GLS -17.9, EF 50-55, mild diff HK    His Past Surgical History Is Significant For: Past Surgical History:  Procedure Laterality Date  . NO PAST SURGERIES    . WISDOM TOOTH EXTRACTION      His Family History Is Significant For: Family History  Problem Relation Age of Onset  . Healthy Mother   . CAD Father   . CVA Maternal Grandmother   . CAD Maternal Grandfather     His Social History Is Significant For: Social History   Social History  . Marital status: Single    Spouse name: N/A  . Number of children: N/A  . Years of education: N/A   Social History Main Topics  . Smoking status: Never Smoker  . Smokeless tobacco: Never Used  . Alcohol use No  . Drug use: No  . Sexual activity: Not Asked   Other Topics Concern  . None   Social History Narrative  . None    His Allergies Are:  No Known Allergies:   His Current Medications Are:  Outpatient Encounter Prescriptions as of 12/11/2016  Medication Sig  . ibuprofen (ADVIL,MOTRIN) 200 MG tablet Take 1 tablet (200 mg total) by mouth every 6 (six) hours as needed.   No facility-administered encounter medications on file as of 12/11/2016.   :  Review of Systems:  Out  of a complete 14 point review of systems, all are reviewed and negative with the exception of these symptoms as listed below: Review of Systems  Neurological:       Pt presents today to discuss shortness of breath and "snorting" while he talks. Pt has already seen an ENT.  Epworth Sleepiness Scale 0= would never doze 1= slight chance of dozing 2= moderate chance of dozing 3= high chance of dozing  Sitting and reading: 0 Watching TV: 1 Sitting inactive in a public place (ex. Theater or  meeting): 0 As a passenger in a car for an hour without a break: 0 Lying down to rest in the afternoon: 0 Sitting and talking to someone: 0 Sitting quietly after lunch (no alcohol): 0 In a car, while stopped in traffic: 0 Total: 1     Objective:  Neurologic Exam  Physical Exam Physical Examination:   Vitals:   12/11/16 1618  BP: (!) 141/92  Pulse: (!) 121   General Examination: The patient is a very pleasant 26 y.o. male in no acute distress. He appears well-developed and well-nourished and well groomed. He is mildly anxious appearing.  HEENT: Normocephalic, atraumatic, pupils are equal, round and reactive to light and accommodation. Extraocular tracking is good without limitation to gaze excursion or nystagmus noted. Normal smooth pursuit is noted. Hearing is grossly intact. Face is symmetric with normal facial animation and normal facial sensation. Speech is clear with no dysarthria noted. He appears to breathing through his mouth, unclear if he brings in and out of through his mouth, makes a snorting or clicking sound while breathing in. There is no lip, neck/head, jaw or voice tremor. Neck is supple with full range of passive and active motion. There are no carotid bruits on auscultation. Oropharynx exam reveals: mild mouth dryness, adequate dental hygiene and moderate airway crowding, due to smaller airway entry, tonsils of 1-2+ bilaterally, larger uvula. Mallampati is class II. Tongue protrudes centrally and palate elevates symmetrically. Neck size is 16 inches. He has a Mild overbite. Nasal inspection reveals no significant nasal mucosal bogginess, no significant septal deviation, mild inferior turbinate hypertrophy, smaller nasal passages.   Chest: Clear to auscultation without wheezing, rhonchi or crackles noted.  Heart: S1+S2+0, regular and normal without murmurs, rubs or gallops noted.   Abdomen: Soft, non-tender and non-distended with normal bowel sounds appreciated on  auscultation.  Extremities: There is no pitting edema in the distal lower extremities bilaterally. Pedal pulses are intact.  Skin: Warm and dry without trophic changes noted.  Musculoskeletal: exam reveals no obvious joint deformities, tenderness or joint swelling or erythema.   Neurologically:  Mental status: The patient is awake, alert and oriented in all 4 spheres. His immediate and remote memory, attention, language skills and fund of knowledge are appropriate. There is no evidence of aphasia, agnosia, apraxia or anomia. Speech is clear with normal prosody and enunciation. Thought process is linear. Mood is normal and affect is normal.  Cranial nerves II - XII are as described above under HEENT exam. In addition: shoulder shrug is normal with equal shoulder height noted. Motor exam: Normal bulk, strength and tone is noted. There is no drift, tremor or rebound. Romberg is negative. Reflexes are 2+ throughout. Fine motor skills and coordination: intact with normal finger taps, normal hand movements, normal rapid alternating patting, normal foot taps and normal foot agility.  Cerebellar testing: No dysmetria or intention tremor on finger to nose testing. Heel to shin is unremarkable bilaterally. There is no  truncal or gait ataxia.  Sensory exam: intact to light touch, vibration, temperature sense in the upper and lower extremities.  Gait, station and balance: He stands easily. No veering to one side is noted. No leaning to one side is noted. Posture is age-appropriate and stance is narrow based. Gait shows normal stride length and normal pace. No problems turning are noted. Tandem walk is unremarkable.           Assessment and Plan:  In summary, Joshua Christensen is a very pleasant 26 y.o.-year old male with an underlying medical history of viral myocarditis, right bundle branch block, history of acute respiratory failure with respiratory hypoxia, for which he was hospitalized twice in January  2018, history of renal insufficiency, bradycardia, history of cellulitis, reflux disease, allergic rhinitis, chronic sinusitis, whose history and physical exam are concerning for obstructive sleep apnea (OSA). I had a long chat with the patient about my findings and the diagnosis of OSA, its prognosis and treatment options. We talked about medical treatments, surgical interventions and non-pharmacological approaches. I explained in particular the risks and ramifications of untreated moderate to severe OSA, especially with respect to developing cardiovascular disease down the Road, including congestive heart failure, difficult to treat hypertension, cardiac arrhythmias, or stroke. Even type 2 diabetes has, in part, been linked to untreated OSA. Symptoms of untreated OSA include daytime sleepiness, memory problems, mood irritability and mood disorder such as depression and anxiety, lack of energy, as well as recurrent headaches, especially morning headaches. I recommended the following at this time: sleep study with potential positive airway pressure titration. (We will score hypopneas at 3%).   I explained the sleep test procedure to the patient and also outlined possible surgical and non-surgical treatment options of OSA, I also explained the CPAP treatment option to the patient, who indicated that he would be willing to try CPAP if the need arises. I explained the importance of being compliant with PAP treatment, not only for insurance purposes but primarily to improve His symptoms, and for the patient's long term health benefit, including to reduce His cardiovascular risks. I answered all his questions today and the patient was in agreement. I would like to see him back after the sleep study is completed and encouraged him to call with any interim questions, concerns, problems or updates.   Thank you very much for allowing me to participate in the care of this nice patient. If I can be of any further  assistance to you please do not hesitate to call me at [phone].  Sincerely,   Joshua Foley, MD, PhD

## 2016-12-17 ENCOUNTER — Other Ambulatory Visit: Payer: Self-pay

## 2016-12-17 DIAGNOSIS — R519 Headache, unspecified: Secondary | ICD-10-CM

## 2016-12-17 DIAGNOSIS — R0681 Apnea, not elsewhere classified: Secondary | ICD-10-CM

## 2016-12-17 DIAGNOSIS — R351 Nocturia: Secondary | ICD-10-CM

## 2016-12-17 DIAGNOSIS — R0683 Snoring: Secondary | ICD-10-CM

## 2016-12-17 DIAGNOSIS — R51 Headache: Secondary | ICD-10-CM

## 2016-12-17 DIAGNOSIS — Z8709 Personal history of other diseases of the respiratory system: Secondary | ICD-10-CM

## 2016-12-23 ENCOUNTER — Ambulatory Visit (INDEPENDENT_AMBULATORY_CARE_PROVIDER_SITE_OTHER): Payer: BLUE CROSS/BLUE SHIELD | Admitting: Neurology

## 2016-12-23 DIAGNOSIS — G4733 Obstructive sleep apnea (adult) (pediatric): Secondary | ICD-10-CM

## 2016-12-23 DIAGNOSIS — R0683 Snoring: Secondary | ICD-10-CM

## 2016-12-23 DIAGNOSIS — G479 Sleep disorder, unspecified: Secondary | ICD-10-CM

## 2016-12-24 ENCOUNTER — Telehealth: Payer: Self-pay

## 2016-12-24 NOTE — Telephone Encounter (Signed)
-----   Message from Huston FoleySaima Athar, MD sent at 12/24/2016  8:15 AM EDT ----- Patient referred by Dr. Haroldine Lawsrossley, seen by me on 12/11/16, HST on 12/23/16.   Please call and notify the patient that the recent home sleep test did not show any significant obstructive sleep apnea. Patient can follow up with the referring provider.  Please remind patient to try to maintain good sleep hygiene, which means: Keep a regular sleep and wake schedule, try not to exercise or have a meal within 2 hours of your bedtime, try to keep your bedroom conducive for sleep, that is, cool and dark, without light distractors such as an illuminated alarm clock, and refrain from watching TV right before sleep or in the middle of the night and do not keep the TV or radio on during the night. Also, try not to use or play on electronic devices at bedtime, such as your cell phone, tablet PC or laptop. If you like to read at bedtime on an electronic device, try to dim the background light as much as possible. Do not eat in the middle of the night.    A copy of the report will be sent to the patient, the PCP and referring MD, if other than PCP.  Once you have spoken to patient, you can close this encounter.   Thanks,  Huston FoleySaima Athar, MD, PhD [facility])

## 2016-12-24 NOTE — Telephone Encounter (Signed)
Patient called back and I was able to go over results with him.

## 2016-12-24 NOTE — Progress Notes (Signed)
Patient referred by Dr. Haroldine Lawsrossley, seen by me on 12/11/16, HST on 12/23/16.   Please call and notify the patient that the recent home sleep test did not show any significant obstructive sleep apnea. Patient can follow up with the referring provider.  Please remind patient to try to maintain good sleep hygiene, which means: Keep a regular sleep and wake schedule, try not to exercise or have a meal within 2 hours of your bedtime, try to keep your bedroom conducive for sleep, that is, cool and dark, without light distractors such as an illuminated alarm clock, and refrain from watching TV right before sleep or in the middle of the night and do not keep the TV or radio on during the night. Also, try not to use or play on electronic devices at bedtime, such as your cell phone, tablet PC or laptop. If you like to read at bedtime on an electronic device, try to dim the background light as much as possible. Do not eat in the middle of the night.   A copy of the report will be sent to the patient, the PCP and referring MD, if other than PCP.  Once you have spoken to patient, you can close this encounter.   Thanks,  Huston FoleySaima Icarus Partch, MD, PhD [facility])

## 2016-12-24 NOTE — Telephone Encounter (Signed)
I LM on cell with results and recommendations below. I sent a copy to PCP and Dr. Haroldine Lawsrossley.

## 2016-12-24 NOTE — Procedures (Signed)
  [facility] [address], [address] [place], [place] [address]  NAME: Joshua CrockerRharcell Remund DOB: [birth year] MEDICAL RECORD [id number],  DOS: 12/23/16 REFERRING PHYSICIAN: Hermelinda MedicusJames Crossley, MD  Study Performed:  HST/Out of Center Sleep Test  HISTORY:   26 year old man with a history of viral myocarditis, right bundle branch block, history of acute respiratory failure with respiratory hypoxia, for which he was hospitalized twice in January 2018, history of renal insufficiency, bradycardia, history of cellulitis, reflux disease, allergic rhinitis, chronic sinusitis, who reports snoring and excessive daytime somnolence and inspiratory snorting. His Epworth sleepiness score is 1 out of 24, fatigue score is 55 out of 63. BMI of 24.9.  STUDY RESULTS:  Total Recording Time: 7h 1140m  Total Apnea/Hypopnea Index (AHI): 2.7/hour  Average Oxygen Saturation: 95%  Lowest Oxygen Saturation: 84%  Time below 88% saturation: 0 min  Average Mean Heart Rate:   66  IMPRESSION: Snoring, Sleep disturbances  RECOMMENDATION: This home sleep test does not demonstrate any significant obstructive or central sleep disordered breathing. Some snoring was noted. Other causes of the patient's symptoms, including circadian rhythm disturbances, an underlying mood disorder, medication effect and/or an underlying medical problem cannot be ruled out based on this test. Clinical correlation is recommended. The patient and the referring provider will be notified of the test results.    I certify that I have reviewed the raw data recording prior to the issuance of this report in accordance with the standards of Accreditation of the American Academy of Sleep medicine (AASM).  Huston FoleySaima Evona Westra, MD, PhD Diplomat, ABPN (Neurology and Sleep)

## 2016-12-26 ENCOUNTER — Institutional Professional Consult (permissible substitution): Payer: Self-pay | Admitting: Neurology

## 2017-04-14 ENCOUNTER — Ambulatory Visit: Payer: BLUE CROSS/BLUE SHIELD | Admitting: Cardiovascular Disease

## 2017-04-23 ENCOUNTER — Encounter: Payer: Self-pay | Admitting: Cardiovascular Disease

## 2017-04-23 ENCOUNTER — Ambulatory Visit (INDEPENDENT_AMBULATORY_CARE_PROVIDER_SITE_OTHER): Payer: BLUE CROSS/BLUE SHIELD | Admitting: Cardiovascular Disease

## 2017-04-23 VITALS — BP 124/77 | HR 79 | Ht 66.0 in | Wt 152.4 lb

## 2017-04-23 DIAGNOSIS — R0789 Other chest pain: Secondary | ICD-10-CM | POA: Diagnosis not present

## 2017-04-23 DIAGNOSIS — I514 Myocarditis, unspecified: Secondary | ICD-10-CM

## 2017-04-23 DIAGNOSIS — R0602 Shortness of breath: Secondary | ICD-10-CM

## 2017-04-23 NOTE — Progress Notes (Signed)
Cardiology Office Note   Date:  04/23/2017   ID:  Joshua Christensen, DOB Jul 17, 1991, MRN [id number]  PCP:  Joshua Cage, PA  Cardiologist:   Skeet Latch, MD   Chief Complaint  Patient presents with  . Follow-up     History of Present Illness: Joshua Christensen is a 26 y.o. male with prior myocarditis who presents for follow up.  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  He was admitted 08/2015 with chest pain and elevated troponin.  Echo revealed LVEF 55-60% and was otherwise unremarkable.  He had a cardiac MRI 08/22/16 that revealed mild diffuse late gadolinium enhancement consistnet with myocarditis.  He was started on beta blockers and NSAIDS.  He was readmitted days later with shortness of breath.  Echo was unchanged and his beta blocker was stopped 2/2 bradycardia.  Ibuprofen was switched to indomethacin.  He followed up with Dr. Marlou Christensen and continued to complain of shortness of breath.  He had a chest CT that was negative for PE.  He was seen by Dr. Jalene Christensen at [facility] who put him back on colchicine.  However he stopped this due to diarrhea.  He followed up with Joshua Christensen on 09/2016 and continued to note chest pain and shortness of breath.  In the interim he was treated with prednisone and Augmentin for sinusitis.  At that appointment there was no evidence of heart failure.  He had a repeat echo 10/17/16 that was normal.  BNP was normal.  WBC was elevated, though he was on prednisone.  Cardiac enzymes were within normal limits and ESR was normal.  He was referred for a sleep study that was negative for OSA.  Since his last appointment Joshua Christensen had sinus surgery.  His breathing has improved significantly and he no longer has problems with snorting or snoring.  On bad days he still has some mild shortness of breath, but nothing like before. He has no chest pain. He is now able to exercise again without chest pain or shortness of breath. He denies lower  extremity edema, orthopnea, or PND.   Past Medical History:  Diagnosis Date  . Chest pain   . Gastroenteritis   . History of echocardiogram    Echo 3/18: GLS -17.9, EF 50-55, mild diff HK    Past Surgical History:  Procedure Laterality Date  . NO PAST SURGERIES    . WISDOM TOOTH EXTRACTION       Current Outpatient Prescriptions  Medication Sig Dispense Refill  . levocetirizine (XYZAL) 5 MG tablet Take by mouth.     No current facility-administered medications for this visit.     Allergies:   Patient has no known allergies.    Social History:  The patient  reports that he has never smoked. He has never used smokeless tobacco. He reports that he does not drink alcohol or use drugs.   Family History:  The patient's family history includes CAD in his father and maternal grandfather; CVA in his maternal grandmother; Healthy in his mother.    ROS:  Please see the history of present illness.   Otherwise, review of systems are positive for L face numbness.   All other systems are reviewed and negative.    PHYSICAL EXAM: VS:  BP 124/77   Pulse 79   Ht 5' 6"  (1.676 m)   Wt 69.1 kg (152 lb 6.4 oz)   BMI 24.60 kg/m  , BMI Body mass index is 24.6 kg/m. GENERAL:  Well appearing HEENT: Pupils equal round and reactive, fundi not visualized, oral mucosa unremarkable NECK:  No jugular venous distention, waveform within normal limits, carotid upstroke brisk and symmetric, no bruits LYMPHATICS:  No cervical adenopathy LUNGS:  Clear to auscultation bilaterally HEART:  RRR.  PMI not displaced or sustained,S1 and S2 within normal limits, no S3, no S4, no clicks, no rubs, no murmurs ABD:  Flat, positive bowel sounds normal in frequency in pitch, no bruits, no rebound, no guarding, no midline pulsatile mass, no hepatomegaly, no splenomegaly EXT:  2 plus pulses throughout, no edema, no cyanosis no clubbing SKIN:  No rashes no nodules NEURO:  Cranial nerves II through XII grossly intact,  motor grossly intact throughout PSYCH:  Cognitively intact, oriented to person place and time  EKG:  EKG is ordered today. 04/23/17: Sinus rhythm. Rate 79 bpm.   Echo 09/16/16 ([facility]) EF >55, no effusion  48 hour Holter 09/11/16  2.8 second pause at 11:25 PM-asymptomatic  Average heart rate 66 bpm, sinus rhythm  No atrial fibrillation, no adverse arrhythmias detected  Sinus arrhythmia noted.  Rare PVCs, 3.  Chest CTA 09/06/16 IMPRESSION: Negative for pulmonary embolus. Negative chest CT.  Echo 08/26/16 EF 60-65, no effusion  MRI 08/22/16 IMPRESSION: 1. Normal left ventricular size, thickness and systolic function (LVEF = 58%) with no regional wall motion abnormalities. There is mild diffuse mid epicardial late gadolinium enhancement in the mid anterior, anteroseptal, apical septal and anterior walls. 2. Normal right ventricular size, thickness and systolic function (LVEF = 55%) with no regional wall motion abnormalities. 3. Normal biatrial size. 4. Mild tricuspid regurgitation. 5. Normal size of the aortic root and thoracic aorta. 6. Normal pericardium, no pericardial effusion. 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 No evidence for pericarditis.  Echo 08/22/16 EF 55-60, normal wall motion, normal diastolic function, trivial TR, no effusion   Recent Labs: 08/26/2016: ALT 25; B Natriuretic Peptide 18.3; TSH 2.422 10/11/2016: BUN 12; Creatinine, Ser 1.20; Hemoglobin 15.4; NT-Pro BNP 21; Platelets 259; Potassium 4.0; Sodium 141    Lipid Panel No results found for: CHOL, TRIG, HDL, CHOLHDL, VLDL, LDLCALC, LDLDIRECT    Wt Readings from Last 3 Encounters:  04/23/17 69.1 kg (152 lb 6.4 oz)  12/11/16 68 kg (150 lb)  12/06/16 68.5 kg (151 lb)      ASSESSMENT AND PLAN:  # Viral myocarditis: # Shortness of breath: Resolved.     Current medicines are reviewed at length  with the patient today.  The patient does not have concerns regarding medicines.  The following changes have been made:  none  Labs/ tests ordered today include:  No orders of the defined types were placed in this encounter.    Disposition:   FU with Ahana Najera C. Oval Linsey, MD, [facility] as needed.   This note was written with the assistance of speech recognition software.  Please excuse any transcriptional errors.  Signed, Kincade Granberg C. Oval Linsey, MD, [facility]  04/23/2017 2:27 PM    [facility]

## 2017-04-23 NOTE — Patient Instructions (Signed)
Medication Instructions:  Your physician recommends that you continue on your current medications as directed. Please refer to the Current Medication list given to you today.  Lab: none  Testing/Procedures: none  Follow-Up: As needed

## 2017-10-09 IMAGING — DX DG CHEST 2V
2 series · 2 of 2 positions shown · non-contrast
Comparison: Chest radiograph performed 08/22/2015

CLINICAL DATA: Acute onset of shortness of breath. Initial
encounter.

EXAM:
CHEST  2 VIEW

[chest pa]
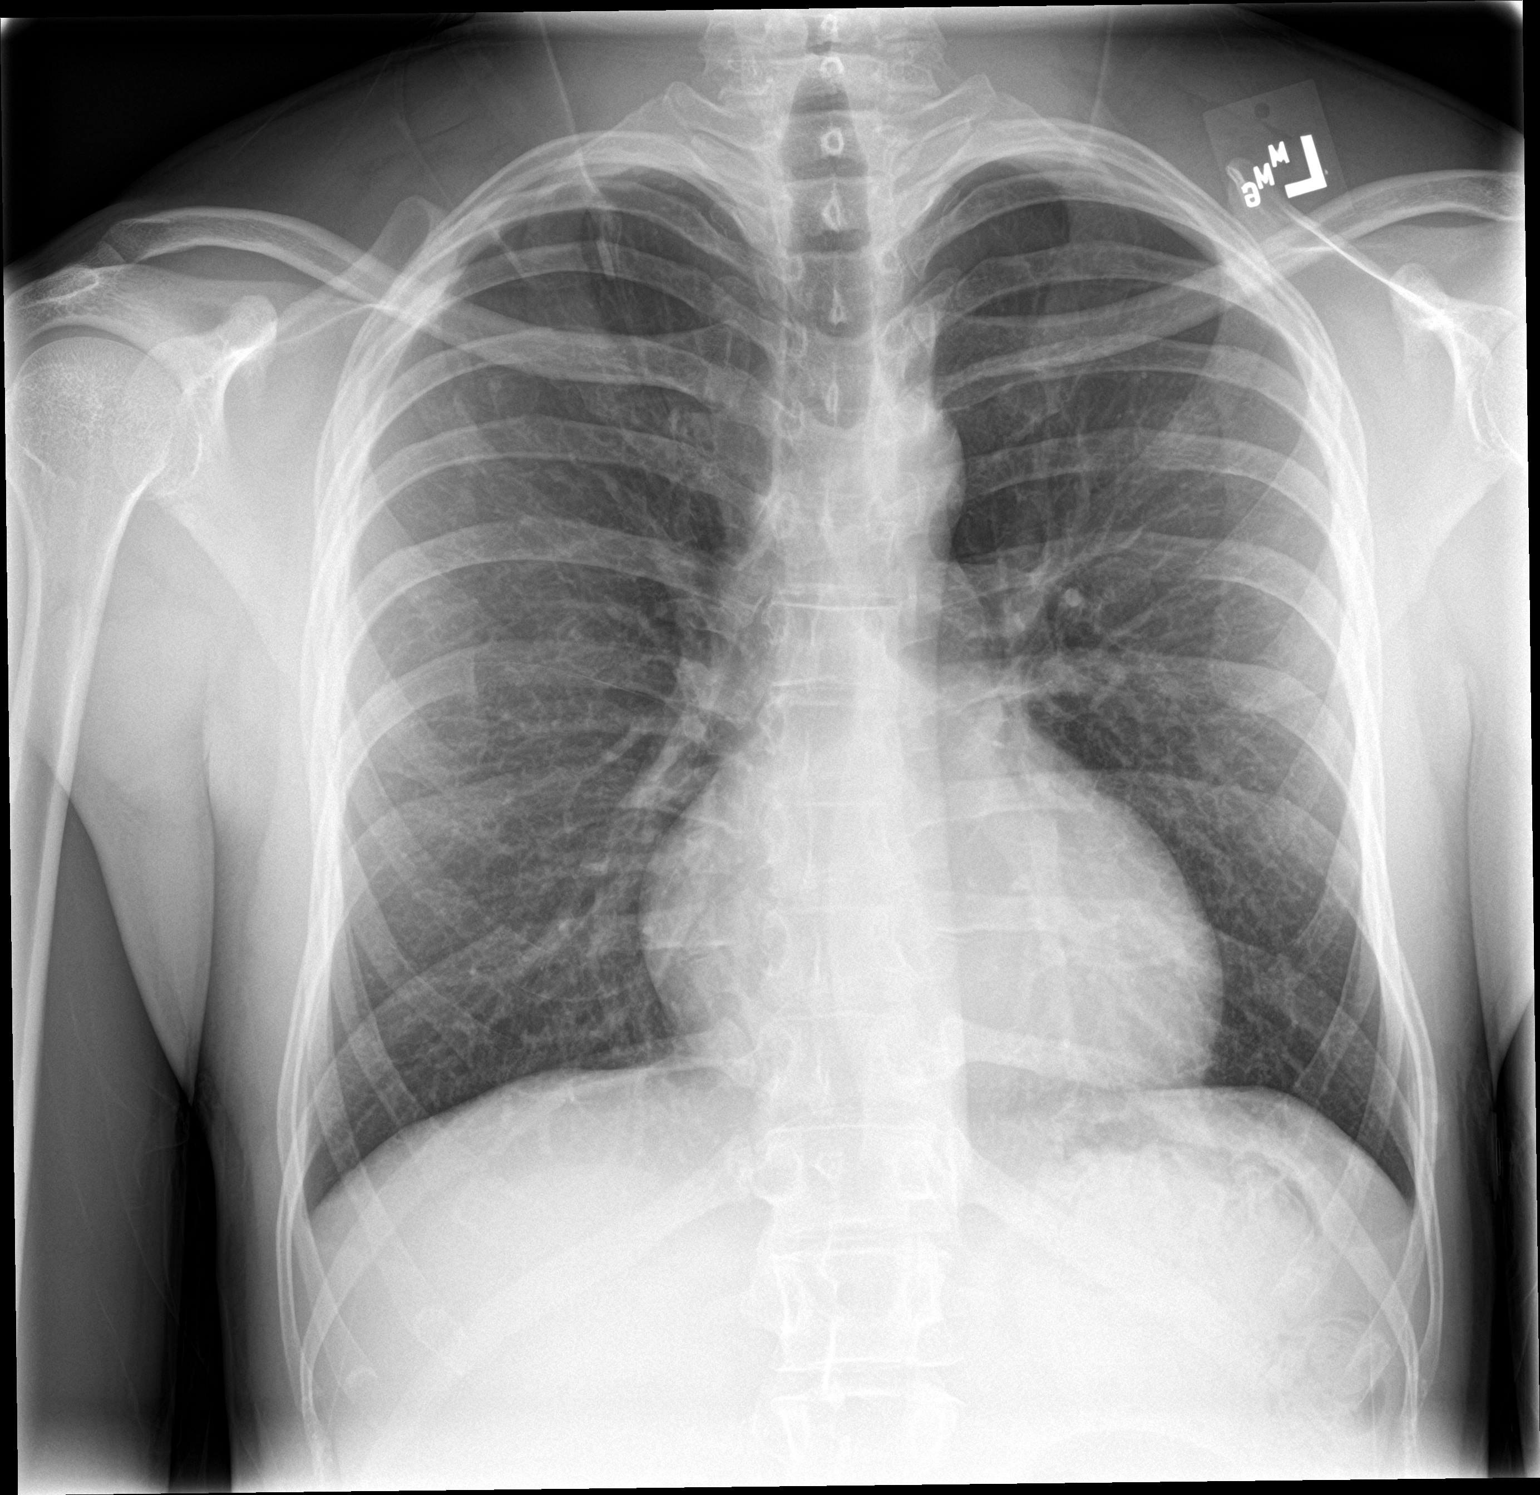

[chest lat]
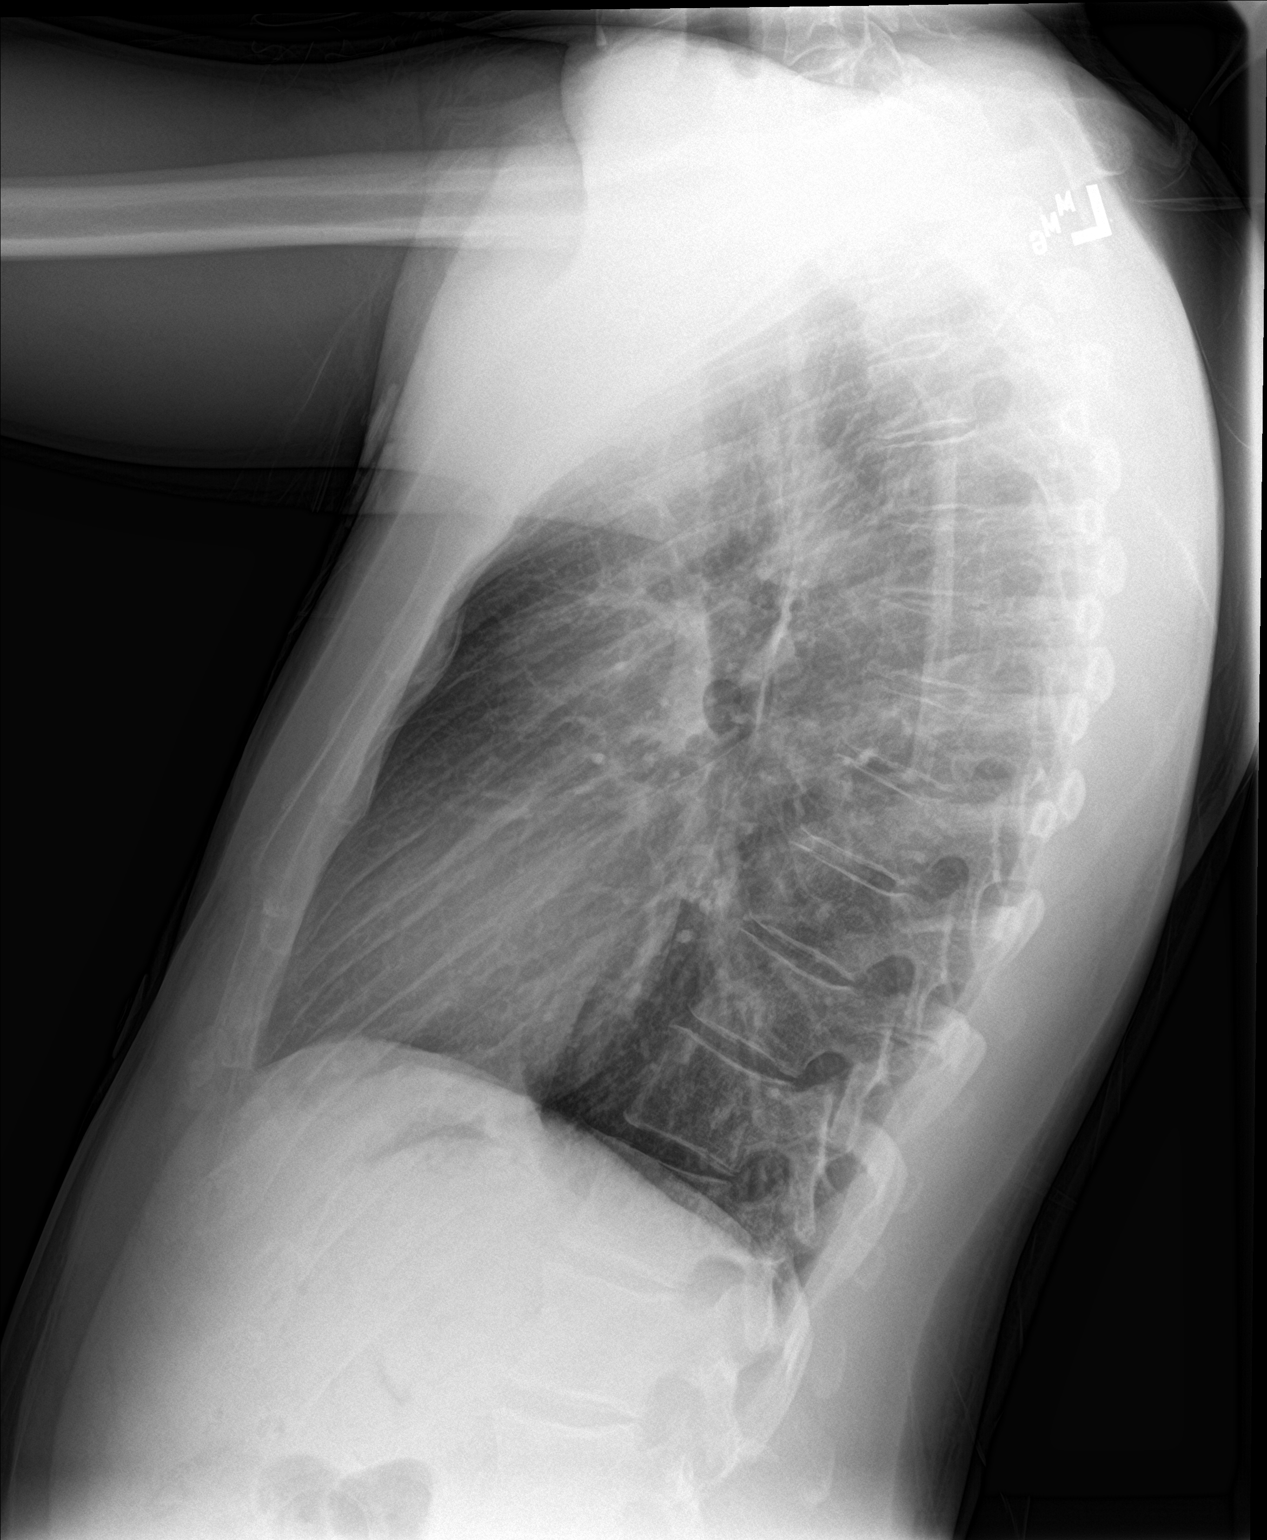

[2 of 2 positions shown; findings below may reference images not displayed]

FINDINGS: The lungs are well-aerated and clear. There is no evidence of focal
opacification, pleural effusion or pneumothorax.

The heart is normal in size; the mediastinal contour is within
normal limits. No acute osseous abnormalities are seen.
IMPRESSION: No acute cardiopulmonary process seen.
